# Patient Record
Sex: Female | Born: 1935
Health system: Southern US, Community
[De-identification: ages and names within clinical notes are randomized; demographics above are authoritative.]

## PROBLEM LIST (undated history)

## (undated) DIAGNOSIS — M1990 Unspecified osteoarthritis, unspecified site: Secondary | ICD-10-CM

## (undated) DIAGNOSIS — R002 Palpitations: Secondary | ICD-10-CM

## (undated) DIAGNOSIS — R202 Paresthesia of skin: Secondary | ICD-10-CM

## (undated) DIAGNOSIS — K589 Irritable bowel syndrome without diarrhea: Secondary | ICD-10-CM

## (undated) DIAGNOSIS — K219 Gastro-esophageal reflux disease without esophagitis: Secondary | ICD-10-CM

## (undated) DIAGNOSIS — I10 Essential (primary) hypertension: Secondary | ICD-10-CM

## (undated) DIAGNOSIS — I739 Peripheral vascular disease, unspecified: Secondary | ICD-10-CM

## (undated) DIAGNOSIS — E7800 Pure hypercholesterolemia, unspecified: Secondary | ICD-10-CM

## (undated) DIAGNOSIS — R079 Chest pain, unspecified: Secondary | ICD-10-CM

## (undated) DIAGNOSIS — F4321 Adjustment disorder with depressed mood: Secondary | ICD-10-CM

## (undated) DIAGNOSIS — I8390 Asymptomatic varicose veins of unspecified lower extremity: Secondary | ICD-10-CM

## (undated) DIAGNOSIS — I2510 Atherosclerotic heart disease of native coronary artery without angina pectoris: Secondary | ICD-10-CM

## (undated) DIAGNOSIS — I4510 Unspecified right bundle-branch block: Secondary | ICD-10-CM

## (undated) DIAGNOSIS — I6529 Occlusion and stenosis of unspecified carotid artery: Secondary | ICD-10-CM

## (undated) DIAGNOSIS — K635 Polyp of colon: Secondary | ICD-10-CM

## (undated) DIAGNOSIS — M542 Cervicalgia: Secondary | ICD-10-CM

## (undated) HISTORY — DX: Paresthesia of skin: R20.2

## (undated) HISTORY — DX: Unspecified osteoarthritis, unspecified site: M19.90

## (undated) HISTORY — DX: Occlusion and stenosis of unspecified carotid artery: I65.29

## (undated) HISTORY — DX: Polyp of colon: K63.5

## (undated) HISTORY — DX: Gastro-esophageal reflux disease without esophagitis: K21.9

## (undated) HISTORY — PX: LAPAROSCOPIC CHOLECYSTECTOMY: SUR755

## (undated) HISTORY — DX: Palpitations: R00.2

## (undated) HISTORY — DX: Chest pain, unspecified: R07.9

## (undated) HISTORY — DX: Peripheral vascular disease, unspecified: I73.9

## (undated) HISTORY — PX: APPENDECTOMY: SHX54

## (undated) HISTORY — PX: ROTATOR CUFF REPAIR: SHX139

## (undated) HISTORY — PX: TONSILLECTOMY: SUR1361

## (undated) HISTORY — PX: TUBAL LIGATION: SHX77

## (undated) HISTORY — DX: Cervicalgia: M54.2

## (undated) HISTORY — DX: Atherosclerotic heart disease of native coronary artery without angina pectoris: I25.10

## (undated) HISTORY — DX: Irritable bowel syndrome, unspecified: K58.9

## (undated) HISTORY — DX: Pure hypercholesterolemia, unspecified: E78.00

## (undated) HISTORY — DX: Adjustment disorder with depressed mood: F43.21

## (undated) HISTORY — PX: BREAST BIOPSY: SHX20

## (undated) HISTORY — DX: Unspecified right bundle-branch block: I45.10

---

## 1993-06-28 HISTORY — PX: BREAST EXCISIONAL BIOPSY: SUR124

## 1998-05-01 ENCOUNTER — Other Ambulatory Visit: Admission: RE | Admit: 1998-05-01 | Discharge: 1998-05-01 | Payer: Self-pay | Admitting: Obstetrics and Gynecology

## 1999-05-12 ENCOUNTER — Other Ambulatory Visit: Admission: RE | Admit: 1999-05-12 | Discharge: 1999-05-12 | Payer: Self-pay | Admitting: Obstetrics and Gynecology

## 1999-08-17 ENCOUNTER — Encounter: Admission: RE | Admit: 1999-08-17 | Discharge: 1999-08-17 | Payer: Self-pay | Admitting: Gastroenterology

## 1999-08-17 ENCOUNTER — Encounter: Payer: Self-pay | Admitting: Gastroenterology

## 1999-08-18 ENCOUNTER — Ambulatory Visit (HOSPITAL_COMMUNITY): Admission: RE | Admit: 1999-08-18 | Discharge: 1999-08-18 | Payer: Self-pay | Admitting: Gastroenterology

## 1999-08-18 ENCOUNTER — Encounter: Payer: Self-pay | Admitting: Gastroenterology

## 1999-09-02 ENCOUNTER — Encounter: Admission: RE | Admit: 1999-09-02 | Discharge: 1999-09-02 | Payer: Self-pay | Admitting: Gastroenterology

## 1999-09-02 ENCOUNTER — Encounter: Payer: Self-pay | Admitting: Gastroenterology

## 1999-09-24 ENCOUNTER — Ambulatory Visit (HOSPITAL_COMMUNITY): Admission: RE | Admit: 1999-09-24 | Discharge: 1999-09-24 | Payer: Self-pay | Admitting: Gastroenterology

## 2000-05-17 ENCOUNTER — Other Ambulatory Visit: Admission: RE | Admit: 2000-05-17 | Discharge: 2000-05-17 | Payer: Self-pay | Admitting: Obstetrics and Gynecology

## 2000-05-24 ENCOUNTER — Encounter: Admission: RE | Admit: 2000-05-24 | Discharge: 2000-05-24 | Payer: Self-pay | Admitting: Obstetrics and Gynecology

## 2000-05-24 ENCOUNTER — Encounter: Payer: Self-pay | Admitting: Obstetrics and Gynecology

## 2001-06-12 ENCOUNTER — Other Ambulatory Visit: Admission: RE | Admit: 2001-06-12 | Discharge: 2001-06-12 | Payer: Self-pay | Admitting: Obstetrics and Gynecology

## 2001-06-15 ENCOUNTER — Encounter: Payer: Self-pay | Admitting: Obstetrics and Gynecology

## 2001-06-15 ENCOUNTER — Encounter: Admission: RE | Admit: 2001-06-15 | Discharge: 2001-06-15 | Payer: Self-pay | Admitting: Obstetrics and Gynecology

## 2001-08-22 ENCOUNTER — Ambulatory Visit (HOSPITAL_COMMUNITY): Admission: RE | Admit: 2001-08-22 | Discharge: 2001-08-22 | Payer: Self-pay | Admitting: Gastroenterology

## 2002-01-19 ENCOUNTER — Encounter: Admission: RE | Admit: 2002-01-19 | Discharge: 2002-01-19 | Payer: Self-pay | Admitting: Obstetrics and Gynecology

## 2002-01-19 ENCOUNTER — Encounter: Payer: Self-pay | Admitting: Obstetrics and Gynecology

## 2002-02-12 ENCOUNTER — Encounter: Payer: Self-pay | Admitting: Internal Medicine

## 2002-02-12 ENCOUNTER — Encounter: Admission: RE | Admit: 2002-02-12 | Discharge: 2002-02-12 | Payer: Self-pay | Admitting: Internal Medicine

## 2002-07-17 ENCOUNTER — Encounter: Admission: RE | Admit: 2002-07-17 | Discharge: 2002-07-17 | Payer: Self-pay | Admitting: Obstetrics and Gynecology

## 2002-07-17 ENCOUNTER — Encounter: Payer: Self-pay | Admitting: Obstetrics and Gynecology

## 2002-07-18 ENCOUNTER — Encounter: Payer: Self-pay | Admitting: Obstetrics and Gynecology

## 2002-07-18 ENCOUNTER — Encounter: Admission: RE | Admit: 2002-07-18 | Discharge: 2002-07-18 | Payer: Self-pay | Admitting: Obstetrics and Gynecology

## 2003-07-19 ENCOUNTER — Encounter: Admission: RE | Admit: 2003-07-19 | Discharge: 2003-07-19 | Payer: Self-pay | Admitting: Obstetrics and Gynecology

## 2003-08-21 ENCOUNTER — Other Ambulatory Visit: Admission: RE | Admit: 2003-08-21 | Discharge: 2003-08-21 | Payer: Self-pay | Admitting: Obstetrics & Gynecology

## 2003-10-03 ENCOUNTER — Encounter: Admission: RE | Admit: 2003-10-03 | Discharge: 2003-10-03 | Payer: Self-pay | Admitting: Internal Medicine

## 2004-02-14 ENCOUNTER — Other Ambulatory Visit: Admission: RE | Admit: 2004-02-14 | Discharge: 2004-02-14 | Payer: Self-pay | Admitting: Obstetrics & Gynecology

## 2004-08-27 ENCOUNTER — Other Ambulatory Visit: Admission: RE | Admit: 2004-08-27 | Discharge: 2004-08-27 | Payer: Self-pay | Admitting: Obstetrics & Gynecology

## 2004-09-15 ENCOUNTER — Encounter: Admission: RE | Admit: 2004-09-15 | Discharge: 2004-09-15 | Payer: Self-pay | Admitting: Obstetrics & Gynecology

## 2004-11-11 ENCOUNTER — Observation Stay (HOSPITAL_COMMUNITY): Admission: RE | Admit: 2004-11-11 | Discharge: 2004-11-12 | Payer: Self-pay | Admitting: Specialist

## 2005-05-07 ENCOUNTER — Encounter: Admission: RE | Admit: 2005-05-07 | Discharge: 2005-05-07 | Payer: Self-pay | Admitting: Otolaryngology

## 2005-09-21 ENCOUNTER — Encounter: Admission: RE | Admit: 2005-09-21 | Discharge: 2005-09-21 | Payer: Self-pay | Admitting: Obstetrics & Gynecology

## 2005-09-29 ENCOUNTER — Other Ambulatory Visit: Admission: RE | Admit: 2005-09-29 | Discharge: 2005-09-29 | Payer: Self-pay | Admitting: Obstetrics & Gynecology

## 2005-09-29 ENCOUNTER — Encounter: Admission: RE | Admit: 2005-09-29 | Discharge: 2005-09-29 | Payer: Self-pay | Admitting: Obstetrics & Gynecology

## 2006-07-01 ENCOUNTER — Encounter: Admission: RE | Admit: 2006-07-01 | Discharge: 2006-07-01 | Payer: Self-pay | Admitting: Gastroenterology

## 2006-09-23 ENCOUNTER — Encounter: Admission: RE | Admit: 2006-09-23 | Discharge: 2006-09-23 | Payer: Self-pay | Admitting: Obstetrics & Gynecology

## 2006-11-09 ENCOUNTER — Encounter: Admission: RE | Admit: 2006-11-09 | Discharge: 2006-11-09 | Payer: Self-pay | Admitting: Obstetrics & Gynecology

## 2007-10-04 ENCOUNTER — Encounter: Admission: RE | Admit: 2007-10-04 | Discharge: 2007-10-04 | Payer: Self-pay | Admitting: Obstetrics & Gynecology

## 2007-10-13 ENCOUNTER — Encounter: Admission: RE | Admit: 2007-10-13 | Discharge: 2007-10-13 | Payer: Self-pay | Admitting: Obstetrics & Gynecology

## 2008-10-14 ENCOUNTER — Encounter: Admission: RE | Admit: 2008-10-14 | Discharge: 2008-10-14 | Payer: Self-pay | Admitting: Surgery

## 2009-03-11 ENCOUNTER — Encounter: Admission: RE | Admit: 2009-03-11 | Discharge: 2009-03-11 | Payer: Self-pay | Admitting: Gastroenterology

## 2009-06-26 ENCOUNTER — Ambulatory Visit (HOSPITAL_COMMUNITY): Admission: RE | Admit: 2009-06-26 | Discharge: 2009-06-26 | Payer: Self-pay | Admitting: Gastroenterology

## 2009-07-24 ENCOUNTER — Encounter: Admission: RE | Admit: 2009-07-24 | Discharge: 2009-07-24 | Payer: Self-pay | Admitting: Obstetrics & Gynecology

## 2009-08-28 ENCOUNTER — Encounter: Admission: RE | Admit: 2009-08-28 | Discharge: 2009-08-28 | Payer: Self-pay | Admitting: Internal Medicine

## 2009-10-22 ENCOUNTER — Encounter: Admission: RE | Admit: 2009-10-22 | Discharge: 2009-10-22 | Payer: Self-pay | Admitting: Obstetrics & Gynecology

## 2010-01-22 ENCOUNTER — Encounter (INDEPENDENT_AMBULATORY_CARE_PROVIDER_SITE_OTHER): Payer: Self-pay | Admitting: General Surgery

## 2010-01-22 ENCOUNTER — Observation Stay (HOSPITAL_COMMUNITY): Admission: EM | Admit: 2010-01-22 | Discharge: 2010-01-23 | Payer: Self-pay | Admitting: Emergency Medicine

## 2010-07-19 ENCOUNTER — Encounter: Payer: Self-pay | Admitting: Obstetrics & Gynecology

## 2010-08-10 ENCOUNTER — Other Ambulatory Visit: Payer: Self-pay | Admitting: Obstetrics & Gynecology

## 2010-08-10 DIAGNOSIS — N6453 Retraction of nipple: Secondary | ICD-10-CM

## 2010-08-10 DIAGNOSIS — N644 Mastodynia: Secondary | ICD-10-CM

## 2010-08-14 ENCOUNTER — Ambulatory Visit
Admission: RE | Admit: 2010-08-14 | Discharge: 2010-08-14 | Disposition: A | Discharge status: Home / Self Care | Payer: Medicare Other | Source: Ambulatory Visit | Attending: Obstetrics & Gynecology | Admitting: Obstetrics & Gynecology

## 2010-08-14 ENCOUNTER — Other Ambulatory Visit: Payer: Self-pay

## 2010-08-14 ENCOUNTER — Other Ambulatory Visit: Payer: Self-pay | Admitting: Obstetrics & Gynecology

## 2010-08-14 ENCOUNTER — Ambulatory Visit
Admission: RE | Admit: 2010-08-14 | Discharge: 2010-08-14 | Disposition: A | Discharge status: Home / Self Care | Payer: Managed Care, Other (non HMO) | Source: Ambulatory Visit | Attending: Obstetrics & Gynecology | Admitting: Obstetrics & Gynecology

## 2010-08-14 DIAGNOSIS — N644 Mastodynia: Secondary | ICD-10-CM

## 2010-08-14 DIAGNOSIS — N6453 Retraction of nipple: Secondary | ICD-10-CM

## 2010-08-21 ENCOUNTER — Other Ambulatory Visit: Payer: Self-pay | Admitting: Obstetrics & Gynecology

## 2010-08-21 DIAGNOSIS — N6453 Retraction of nipple: Secondary | ICD-10-CM

## 2010-08-21 DIAGNOSIS — N6489 Other specified disorders of breast: Secondary | ICD-10-CM

## 2010-08-21 DIAGNOSIS — Z1231 Encounter for screening mammogram for malignant neoplasm of breast: Secondary | ICD-10-CM

## 2010-08-26 ENCOUNTER — Ambulatory Visit
Admission: RE | Admit: 2010-08-26 | Discharge: 2010-08-26 | Disposition: A | Discharge status: Home / Self Care | Payer: Medicare Other | Source: Ambulatory Visit | Attending: Obstetrics & Gynecology | Admitting: Obstetrics & Gynecology

## 2010-08-26 DIAGNOSIS — N6489 Other specified disorders of breast: Secondary | ICD-10-CM

## 2010-08-31 ENCOUNTER — Ambulatory Visit
Admission: RE | Admit: 2010-08-31 | Discharge: 2010-08-31 | Disposition: A | Discharge status: Home / Self Care | Payer: Medicare Other | Source: Ambulatory Visit | Attending: Obstetrics & Gynecology | Admitting: Obstetrics & Gynecology

## 2010-08-31 DIAGNOSIS — N6453 Retraction of nipple: Secondary | ICD-10-CM

## 2010-08-31 MED ORDER — GADOBENATE DIMEGLUMINE 529 MG/ML IV SOLN
13.0000 mL | Freq: Once | INTRAVENOUS | Status: AC | PRN
  Administered 2010-08-31: 13 mL via INTRAVENOUS

## 2010-09-12 LAB — DIFFERENTIAL
Basophils Absolute: 0.1 10*3/uL (ref 0.0–0.1)
Eosinophils Relative: 0 % (ref 0–5)
Lymphocytes Relative: 20 % (ref 12–46)
Monocytes Absolute: 0.6 10*3/uL (ref 0.1–1.0)
Monocytes Relative: 5 % (ref 3–12)

## 2010-09-12 LAB — CBC
Platelets: 252 10*3/uL (ref 150–400)
RBC: 4.84 MIL/uL (ref 3.87–5.11)
WBC: 12.7 10*3/uL — ABNORMAL HIGH (ref 4.0–10.5)

## 2010-09-12 LAB — URINE MICROSCOPIC-ADD ON

## 2010-09-12 LAB — URINALYSIS, ROUTINE W REFLEX MICROSCOPIC
Bilirubin Urine: NEGATIVE
Glucose, UA: NEGATIVE mg/dL
Specific Gravity, Urine: 1.015 (ref 1.005–1.030)
Urobilinogen, UA: 0.2 mg/dL (ref 0.0–1.0)

## 2010-09-12 LAB — COMPREHENSIVE METABOLIC PANEL
AST: 29 U/L (ref 0–37)
Albumin: 4.2 g/dL (ref 3.5–5.2)
Alkaline Phosphatase: 43 U/L (ref 39–117)
Chloride: 101 mEq/L (ref 96–112)
Creatinine, Ser: 0.77 mg/dL (ref 0.4–1.2)
GFR calc Af Amer: >60 mL/min (ref >60)
Potassium: 3.3 mEq/L — ABNORMAL LOW (ref 3.5–5.1)
Total Bilirubin: 1 mg/dL (ref 0.3–1.2)
Total Protein: 7.4 g/dL (ref 6.0–8.3)

## 2010-11-13 NOTE — Op Note (Signed)
[name], [name]               ACCOUNT NO.:  [id number]   MEDICAL RECORD NO.:  [id number]          PATIENT TYPE:  AMB   LOCATION:  DAY                          FACILITY:  [facility]   PHYSICIAN:  Jene Every, M.D.    DATE OF BIRTH:  06-Jul-1935   DATE OF PROCEDURE:  DATE OF DISCHARGE:                                 OPERATIVE REPORT   PREOPERATIVE DIAGNOSES:  Impingement syndrome, rotator cuff tear, adhesive  capsulitis of the right shoulder.   POSTOPERATIVE DIAGNOSES:  Impingement syndrome, rotator cuff tear, adhesive  capsulitis of the right shoulder.   PROCEDURES PERFORMED:  Right shoulder open rotator cuff repair, subacromial  decompression.   SURGEON:  Jene Every, M.D.   ANESTHESIA:  General.   ASSISTANT:  Roma Schanz, P.A.   BRIEF HISTORY AND INDICATIONS:  A 74 year old with refractory shoulder pain  and adhesive capsulitis, hemorrhagic, indicating a rotator cuff tear 2 cm  from the supraspinatus.  Operative intervention was indicated for repair as  well as possible manipulation under anesthesia.  Risks and benefits were  discussed which including bleeding, infection, injury to vascular  structures, fracture, recurrent adhesive capsulitis, etc.   TECHNIQUE:  The patient was placed in the supine position.  After the  induction of adequate general anesthesia and 1 g of Kefzol, the right  shoulder and upper extremity was prepped and draped in the usual sterile  fashion.  First noted was abduction to approximately 30 degrees.  Forward  flexion was 45 degrees.  With a gentle manipulative pressure applied to the  arm, we were able to relax the capsule and achieve 110 degrees of flexion  and forward flexion to 100 as well.  There was good internal and external  rotation  This was done without difficulty and slowly.  Next, incision was  made over the anterior aspect of the acromion in Langer's lines.  Subcutaneous tissue was dissected.  Electrocautery was utilized to  achieve  hemostasis. Raphe between the anterior and lateral heads was identified and  developed.  Subperiosteal elevator was placed anteriorly and then dissected  from anterolateral aspect of the acromion.  Marcaine 0.25% with epinephrine  was infiltrated in the subcutaneous tissues.  The CA ligament was divided  and excised.  The anterolateral spur was removed with the [facility] rongeur and a  high-speed bur.  Digitally, I lysed adhesions in the subacromial space as  well.  A retracted 2-cm tear of the rotator cuff was noted.  It was advanced  with an Allis and digitally mobilized as well.  It was easily advanced to  the portion of the greater trochanter.  I decorticated beneath that,  freshened up the tendon to good bleeding tissue, placed two Mitek suture  anchors in the trough, advanced the tendon, secured it to the trough with  the Mitek suture anchors, and oversewed it 0 Vicryl in interrupted figure-of-  eight sutures for a watertight closure.  The remainder of the tendon was  viable and intact.  There was good range of motion without impingement.  The  wound was copiously irrigated with antibiotic irrigation.  The  raphe was  repaired with #1 Vicryl interrupted figure-of-eight suture.  The  subcutaneous tissue was reapproximated with 2-0 Vicryl simple sutures, and  the skin was reapproximated with 4-0 subcuticular Prolene.  The  wound was re-inforced with Steri-Strips, sterile dressing applied, and  placed supine on the hospital bed after an abduction pillow and sling  placed, and then extubated without difficulty and transported to the  recovery room in satisfactory condition.  There were no complications.  Minimal blood loss.      JB/MEDQ  D:  11/11/2004  T:  11/11/2004  Job:  [id number]

## 2011-01-14 ENCOUNTER — Ambulatory Visit
Admission: RE | Admit: 2011-01-14 | Discharge: 2011-01-14 | Disposition: A | Discharge status: Home / Self Care | Payer: Medicare Other | Source: Ambulatory Visit | Attending: Internal Medicine | Admitting: Internal Medicine

## 2011-01-14 ENCOUNTER — Other Ambulatory Visit: Payer: Self-pay | Admitting: Internal Medicine

## 2011-01-14 DIAGNOSIS — R1032 Left lower quadrant pain: Secondary | ICD-10-CM

## 2011-01-14 MED ORDER — IOHEXOL 300 MG/ML  SOLN
100.0000 mL | Freq: Once | INTRAMUSCULAR | Status: AC | PRN
  Administered 2011-01-14: 100 mL via INTRAVENOUS

## 2011-03-05 ENCOUNTER — Other Ambulatory Visit: Payer: Self-pay | Admitting: Orthopedic Surgery

## 2011-03-05 DIAGNOSIS — M545 Low back pain, unspecified: Secondary | ICD-10-CM

## 2011-03-15 ENCOUNTER — Inpatient Hospital Stay (HOSPITAL_COMMUNITY)
Admission: EM | Admit: 2011-03-15 | Discharge: 2011-03-16 | DRG: 287 | Disposition: A | Discharge status: Home / Self Care | Payer: Medicare Other | Attending: Cardiology | Admitting: Cardiology

## 2011-03-15 ENCOUNTER — Emergency Department (HOSPITAL_COMMUNITY): Payer: Medicare Other

## 2011-03-15 DIAGNOSIS — K589 Irritable bowel syndrome without diarrhea: Secondary | ICD-10-CM | POA: Diagnosis present

## 2011-03-15 DIAGNOSIS — E785 Hyperlipidemia, unspecified: Secondary | ICD-10-CM | POA: Diagnosis present

## 2011-03-15 DIAGNOSIS — I10 Essential (primary) hypertension: Secondary | ICD-10-CM | POA: Diagnosis present

## 2011-03-15 DIAGNOSIS — M542 Cervicalgia: Secondary | ICD-10-CM | POA: Diagnosis present

## 2011-03-15 DIAGNOSIS — Z87891 Personal history of nicotine dependence: Secondary | ICD-10-CM

## 2011-03-15 DIAGNOSIS — Z7982 Long term (current) use of aspirin: Secondary | ICD-10-CM

## 2011-03-15 DIAGNOSIS — K219 Gastro-esophageal reflux disease without esophagitis: Secondary | ICD-10-CM | POA: Diagnosis present

## 2011-03-15 DIAGNOSIS — R0789 Other chest pain: Principal | ICD-10-CM | POA: Diagnosis present

## 2011-03-15 LAB — TSH: TSH: 2.646 u[IU]/mL (ref 0.350–4.500)

## 2011-03-15 LAB — URINALYSIS, ROUTINE W REFLEX MICROSCOPIC
Bilirubin Urine: NEGATIVE
Ketones, ur: NEGATIVE mg/dL
Nitrite: NEGATIVE
Specific Gravity, Urine: 1.007 (ref 1.005–1.030)
Urobilinogen, UA: 0.2 mg/dL (ref 0.0–1.0)
pH: 7.5 (ref 5.0–8.0)

## 2011-03-15 LAB — POCT I-STAT TROPONIN I: Troponin i, poc: 0 ng/mL (ref 0.00–0.08)

## 2011-03-15 LAB — COMPREHENSIVE METABOLIC PANEL
ALT: 18 U/L (ref 0–35)
AST: 26 U/L (ref 0–37)
Calcium: 9.5 mg/dL (ref 8.4–10.5)
Creatinine, Ser: 0.6 mg/dL (ref 0.50–1.10)
GFR calc non Af Amer: >60 mL/min (ref >60)
Sodium: 134 mEq/L — ABNORMAL LOW (ref 135–145)
Total Protein: 6.8 g/dL (ref 6.0–8.3)

## 2011-03-15 LAB — HEMOGLOBIN A1C
Hgb A1c MFr Bld: 5.6 % (ref <5.7)
Mean Plasma Glucose: 114 mg/dL (ref <117)

## 2011-03-15 LAB — CBC
HCT: 34.6 % — ABNORMAL LOW (ref 36.0–46.0)
MCHC: 36.1 g/dL — ABNORMAL HIGH (ref 30.0–36.0)
Platelets: 210 10*3/uL (ref 150–400)
RDW: 12.3 % (ref 11.5–15.5)
WBC: 7.2 10*3/uL (ref 4.0–10.5)

## 2011-03-15 LAB — APTT: aPTT: 29 seconds (ref 24–37)

## 2011-03-15 LAB — CK TOTAL AND CKMB (NOT AT ARMC)
CK, MB: 2.2 ng/mL (ref 0.3–4.0)
Relative Index: INVALID (ref 0.0–2.5)

## 2011-03-15 LAB — URINE MICROSCOPIC-ADD ON

## 2011-03-15 LAB — TROPONIN I: Troponin I: <0.3 ng/mL (ref <0.30)

## 2011-03-15 LAB — PROTIME-INR: INR: 0.96 (ref 0.00–1.49)

## 2011-03-16 ENCOUNTER — Other Ambulatory Visit: Payer: Medicare Other

## 2011-03-16 LAB — CBC
HCT: 35 % — ABNORMAL LOW (ref 36.0–46.0)
Hemoglobin: 12.3 g/dL (ref 12.0–15.0)
MCH: 29.5 pg (ref 26.0–34.0)
MCHC: 35.1 g/dL (ref 30.0–36.0)
MCV: 83.9 fL (ref 78.0–100.0)
Platelets: 212 10*3/uL (ref 150–400)
RBC: 4.17 MIL/uL (ref 3.87–5.11)
RDW: 12.4 % (ref 11.5–15.5)
WBC: 7.2 10*3/uL (ref 4.0–10.5)

## 2011-03-16 LAB — CARDIAC PANEL(CRET KIN+CKTOT+MB+TROPI)
CK, MB: 2.1 ng/mL (ref 0.3–4.0)
Relative Index: INVALID (ref 0.0–2.5)
Total CK: 56 U/L (ref 7–177)
Troponin I: <0.3 ng/mL (ref <0.30)

## 2011-03-16 LAB — HEPARIN LEVEL (UNFRACTIONATED)
Heparin Unfractionated: 0.54 IU/mL (ref 0.30–0.70)
Heparin Unfractionated: <0.1 IU/mL — ABNORMAL LOW (ref 0.30–0.70)

## 2011-03-24 ENCOUNTER — Ambulatory Visit
Admission: RE | Admit: 2011-03-24 | Discharge: 2011-03-24 | Disposition: A | Discharge status: Home / Self Care | Payer: Medicare Other | Source: Ambulatory Visit | Attending: Orthopedic Surgery | Admitting: Orthopedic Surgery

## 2011-03-24 DIAGNOSIS — M545 Low back pain, unspecified: Secondary | ICD-10-CM

## 2011-03-26 NOTE — Discharge Summary (Signed)
  [name], [name]               ACCOUNT NO.:  [id number]  MEDICAL RECORD NO.:  [id number]  LOCATION:  3733                         FACILITY:  [facility]  PHYSICIAN:  Jake Bathe, MD      DATE OF BIRTH:  08-02-1935  DATE OF ADMISSION:  03/15/2011 DATE OF DISCHARGE:  03/16/2011                              DISCHARGE SUMMARY   FINAL DIAGNOSES: 1. Jaw pain, neck discomfort concerning for unstable angina. 2. Hyperlipidemia. 3. Hypertension. 4. Gastroesophageal reflux disease. 5. Irritable bowel syndrome.  PRIOR SURGERIES:  Appendectomy, tonsillectomy, tubal ligation, right rotator cuff surgery, and cholecystectomy.  ALLERGIES: 1. CEFTIN. 2. CIPRO.  PROCEDURES:  Left heart catheterization showing no angiographically significant coronary artery disease - tortuous tortuous vessels, normal EF, no gradient.  Cardiac markers were all negative.  Electrolytes unremarkable.  BRIEF HOSPITAL COURSE:  This is a 75 year old female admitted by Dr. Viann Gross last night for symptoms concerning for unstable angina with jaw pain and neck discomfort while she was out shopping with a feeling of weakness and uncomfortableness.  EMS was called and she was transferred to the ER.  She was given nitroglycerin which resulted in relief of the pain and also had aspirin as well.  EKG shows right bundle- branch block, but no ischemic changes.  Heparin was administered overnight.  Her chest pain had resolved overnight, however, she was taken to the Cardiac Catheterization Lab given her worrisome symptoms. Catheterization was reassuring showing no significant disease.  She was discharged postcatheterization, this is anticipated at this point.  DISCHARGE MEDICATIONS:  No changes from home medications.  Cardiac meds are Crestor 1/4 tablet of 5 mg dosing at bedtime, Gross oil 3 times a day 1 g, atenolol 25 mg in the morning, lisinopril/hydrochlorothiazide 1/2 tablet of 20/12.5 mg twice a day, aspirin 81  mg a day, nitroglycerin as needed, Estrace 0.5 mg tablet of 1 mg at bedtime, vitamin D, Prevacid 15 mg every morning, calcium, progesterone in oil 1 capsule daily at bedtime, Excedrin as needed, Metamucil twice a day, and Vagifem twice weekly.  FOLLOWUP:  She will have followup in the clinic in 1 week postcatheterization followup.  Reassurance has been given to the patient.  Continue to treat hypertension.  Blood pressure was in the 130 systolic on discharge.     Jake Bathe, MD     MCS/MEDQ  D:  03/16/2011  T:  03/16/2011  Job:  [id number]  Electronically Signed by Donato Schultz MD on 03/26/2011 06:47:28 AM

## 2011-03-26 NOTE — Cardiovascular Report (Signed)
  [name], RUD NO.:  [id number]  MEDICAL RECORD NO.:  [id number]  LOCATION:  3733                         FACILITY:  [facility]  PHYSICIAN:  Jake Bathe, MD      DATE OF BIRTH:  [birth year]  DATE OF PROCEDURE:  03/16/2011 DATE OF DISCHARGE:                           CARDIAC CATHETERIZATION   PROCEDURE:  Left heart catheterization via the right femoral artery approach, selective coronary angiography, left ventriculogram.  INDICATIONS:  A 75 year old female with unstable angina, jaw pain with advanced age, hypertension, hyperlipidemia with previous nuclear stress test showing no evidence of ischemia.  Dr. Donnie Aho admit her overnight and her story was concerning for unstable angina.  EKG showed right bundle-branch block pattern.  Troponins were negative.  PROCEDURE DETAILS:  Informed consent was obtained.  Risk of stroke, heart attack, death, renal impairment, arterial damage, bleeding were explained to the patient at length.  Questions were answered. Alternatives of treatments were discussed.  The right radial artery approach was attempted, however, after cannulating the radial artery there was minimal flow and a wire was not able to be passed, likely vasospasm.  This approach was aborted and the right groin approach was taken.  This was successful after 1% lidocaine was utilized and femoral head visualized with fluoroscopy.  A 5-French sheath was inserted without difficulty in the right femoral artery.  Judkins left #4 and a Judkins right #4 catheter was used to selectively cannulate the coronary arteries and an angled pigtail was used to insert into the left ventricle.  Multiple views with hand injection of Omnipaque were obtained and a left ventriculogram was performed utilizing 25 mL of contrast in the RAO position.  Total contrast volume was approximately 50 mL.  Total fluoro time was 1.5 minutes.  FINDINGS:  Left main artery splits into the LAD and  circumflex artery. There are 2 diagonal branches, the second of which encompasses quite a significant territory.  The circumflex has 3 obtuse marginal branches and the right coronary artery is the dominant vessel giving rise to the posterior descending artery and 2 other posterolateral branches.  There is no angiographically significant coronary artery disease present.  The vessels are all tortuous likely from longstanding hypertension.  Left ventricular ejection fraction is 65-70% with no wall motion abnormality. There is no gradient across the aortic valve.  There is no significant mitral regurgitation present.  There is no evidence of abdominal aortic aneurysm.  Her distal abdominal aorta is small in caliber.  Left ventricular systolic pressure 158 with an end-diastolic pressure of 16, aortic pressure 151/65 with a mean of 94 mmHg.  No significant gradient across the aortic valve.  PLAN:  We will go ahead and discharge later on today after sheath pull and stable groin.  Reassuring news has been given to the patient.  Chest discomfort/jaw pain likely musculoskeletal or perhaps GERD related.  We will see in followup in 1 week.     Jake Bathe, MD     MCS/MEDQ  D:  03/16/2011  T:  03/16/2011  Job:  [id number]  Electronically Signed by Donato Schultz MD on 03/26/2011 06:47:33 AM

## 2011-03-31 NOTE — H&P (Signed)
NAMEGAY, MONCIVAIS NO.:  [id number]  MEDICAL RECORD NO.:  [id number]  LOCATION:  [facility]                         FACILITY:  [facility]  PHYSICIAN:  Georga Hacking, M.D.DATE OF BIRTH:  10-23-1935  DATE OF ADMISSION:  03/15/2011                              HISTORY & PHYSICAL   REASON FOR ADMISSION:  Jaw pain and neck discomfort.  HISTORY:  The patient is a 75 year old female with a history of hypertension, hyperlipidemia, reflux esophagitis.  She had some vague exertional chest discomfort and evidently had a nuclear perfusion scan that did not show significant ischemia.  She has had nitroglycerin over the years, but has never used it.  She was in her usual state of health today and was out shopping and had the onset of left jaw discomfort associated with the feeling of weakness and uncomfortableness.  EMS was called and she was transported to the [facility] Emergency Room.  She was given nitroglycerin which resulted relief of the pain and also had aspirin that was also administered.  Since arrival, her troponin was normal and other labs were unremarkable.  Chest x-ray was unremarkable and EKG shows a right posterior fascicular block and right bundle-branch block, but no ischemic changes.  She was admitted at this time for unstable angina.  PAST MEDICAL HISTORY:  Remarkable for hyperlipidemia, hypertension, reflux esophagitis, history of irritable bowel syndrome.  PREVIOUS SURGERIES:  Appendectomy, tonsillectomy, bilateral tubal ligation, right rotator cuff surgery, cholecystectomy.  ALLERGIES:  CEFTIN and CIPRO.  CURRENT MEDICATIONS:  Aspirin, atenolol, calcium, chlordiazepoxide, Crestor, estradiol, Excedrin, Back and Body fish oil, lisinopril, Metamucil, nitroglycerin p.r.n., nystatin, Prevacid, Prometrium, Vagifem, vitamin D2.  SOCIAL HISTORY:  She has been married for 48 years.  She is a retired Airline pilot for [employer].  Has 2 children.  She smoked  briefly in her 80s, but has not smoked since then.  FAMILY HISTORY:  Father died at age 9 of multiple problems including heart and kidney, in general body failure.  Mother died of Alzheimer's at age 89, but had a history of vascular aortic disease.  One sister had significant heart and kidney dysfunction.  REVIEW OF SYSTEMS:  Her weight has been stable.  She has no skin problems.  No eye, ear, nose, or throat problems.  No difficulty swallowing.  She does have symptoms of reflux esophagitis and significant constipation.  Notable for recent urinary tract infection. No cough, wheezing, or hemoptysis.  Normally, no dyspnea, has significant headaches and has had workup for this in the past. Arthritis of both of her knees.  Other than as noted above, the remainder of systems is unremarkable.  PHYSICAL EXAMINATION:  GENERAL:  She is a mildly obese pleasant white female, currently in no acute distress. VITAL SIGNS:  Blood pressure is currently 160/70, pulse is currently 72 and regular. SKIN:  Warm and dry. HEENT:  EOMI.  PERRLA.  C and S clear.  Fundi not examined.  Pharynx negative. NECK:  Supple without masses, JVD, thyromegaly, or bruits. LUNGS:  Clear to A and P. CARDIAC:  Normal S1 and S2.  No S3, S4, or murmur.  PMI is nondisplaced. ABDOMEN:  Soft and nontender.  No mass,  hepatosplenomegaly, or aneurysm. Femoral pulses are present at 2+.  Peripheral pulses are 2+ bilaterally. There is no peripheral edema noted. NEUROLOGIC:  Normal cranial nerves, sensory and motor intact.  Good strength bilaterally.  EKG shows sinus rhythm with right bundle-branch block, left anterior hemiblock.  Chest x-ray showed clear lung fields.  CBC was normal and chemistry panel shows a potassium of 3.7 and is otherwise unremarkable.  Initial troponin was negative.  IMPRESSION: 1. Prolonged chest and prolonged jaw discomfort, consistent with     unstable angina pectoris. 2. History of exertional chest  pain, consistent with angina with     clinical diagnosis of angina. 3. Hypertension, currently not well controlled. 4. Hyperlipidemia, under treatment. 5. History of reflux esophagitis. 6. History of headaches. 7. Irritable bowel syndrome.  RECOMMENDATIONS:  The patient will be placed on IV heparin.  Serial enzymes will be obtained, continue on beta-blocker and aspirin.  Keep n.p.o. for possible catheterization as Dr. Anne Fu desires.     Georga Hacking, M.D.     WST/MEDQ  D:  03/15/2011  T:  03/15/2011  Job:  [id number]  cc:   Pearla Dubonnet, M.D. Jake Bathe, MD  Electronically Signed by Lacretia Nicks. Donnie Aho M.D. on 03/31/2011 12:38:18 PM

## 2011-04-02 ENCOUNTER — Ambulatory Visit (HOSPITAL_COMMUNITY)
Admission: RE | Admit: 2011-04-02 | Discharge: 2011-04-02 | Disposition: A | Discharge status: Home / Self Care | Payer: Medicare Other | Source: Ambulatory Visit | Attending: Gastroenterology | Admitting: Gastroenterology

## 2011-04-02 DIAGNOSIS — K219 Gastro-esophageal reflux disease without esophagitis: Secondary | ICD-10-CM | POA: Insufficient documentation

## 2011-04-02 DIAGNOSIS — I10 Essential (primary) hypertension: Secondary | ICD-10-CM | POA: Insufficient documentation

## 2011-04-02 DIAGNOSIS — K921 Melena: Secondary | ICD-10-CM | POA: Insufficient documentation

## 2011-04-06 NOTE — Op Note (Signed)
  NAMEANNALUCIA, LAINO NO.:  [id number]  MEDICAL RECORD NO.:  [id number]  LOCATION:  [facility]                         FACILITY:  [facility]  PHYSICIAN:  Danise Edge, M.D.   DATE OF BIRTH:  April 24, 1936  DATE OF PROCEDURE: DATE OF DISCHARGE:                              OPERATIVE REPORT   REFERRING PHYSICIAN:  Pearla Dubonnet, M.D.  HISTORY:  Ms. Lawrence Roldan is a 75 year old female, born on 1936/06/22.  The patient has unexplained painless hematochezia associated with a normal hemoglobin.  In 1998, the patient underwent a colonoscopy with removal of a rectal adenomatous polyp.  She underwent normal surveillance colonoscopies in 2001, 2006, and 2010.  ENDOSCOPIST:  Danise Edge, M.D.  PREMEDICATIONS: 1. Fentanyl 75 mcg. 2. Versed 9 mg.  PROCEDURE:  The patient was placed in the left lateral decubitus position.  Anal inspection and digital rectal exam were normal.  The Pentax pediatric colonoscope was introduced into the rectum and advanced to the cecum.  A normal-appearing ileocecal valve and appendiceal orifice were identified.  Colonic preparation for the exam today was good.  1. Rectum normal.  Retroflexed view of the distal rectum normal. 2. Sigmoid colon and descending colon normal. 3. Splenic flexure normal. 4. Mass transverse colon normal. 5. Hepatic flexure normal. 6. Ascending colon normal. 7. Cecum and ileocecal valve normal.  ASSESSMENT:  Normal diagnostic proctocolonoscopy of the cecum.  I cannot explain the patient's intermittent hematochezia.          ______________________________ Danise Edge, M.D.     MJ/MEDQ  D:  04/02/2011  T:  04/02/2011  Job:  [id number]  cc:   Pearla Dubonnet, M.D. Fax: [phone]  Electronically Signed by Danise Edge M.D. on 04/06/2011 04:09:53 PM

## 2011-04-07 ENCOUNTER — Emergency Department (HOSPITAL_COMMUNITY): Payer: Medicare Other

## 2011-04-07 ENCOUNTER — Emergency Department (HOSPITAL_COMMUNITY)
Admission: EM | Admit: 2011-04-07 | Discharge: 2011-04-08 | Disposition: A | Discharge status: Home / Self Care | Payer: Medicare Other | Attending: Emergency Medicine | Admitting: Emergency Medicine

## 2011-04-07 DIAGNOSIS — H532 Diplopia: Secondary | ICD-10-CM | POA: Insufficient documentation

## 2011-04-07 DIAGNOSIS — I10 Essential (primary) hypertension: Secondary | ICD-10-CM | POA: Insufficient documentation

## 2011-04-07 DIAGNOSIS — H538 Other visual disturbances: Secondary | ICD-10-CM | POA: Insufficient documentation

## 2011-04-07 LAB — DIFFERENTIAL
Basophils Absolute: 0.1 10*3/uL (ref 0.0–0.1)
Eosinophils Relative: 2 % (ref 0–5)
Lymphocytes Relative: 47 % — ABNORMAL HIGH (ref 12–46)
Lymphs Abs: 2.6 10*3/uL (ref 0.7–4.0)
Monocytes Absolute: 0.3 10*3/uL (ref 0.1–1.0)
Monocytes Relative: 6 % (ref 3–12)

## 2011-04-07 LAB — BASIC METABOLIC PANEL
BUN: 16 mg/dL (ref 6–23)
Creatinine, Ser: 0.82 mg/dL (ref 0.50–1.10)
GFR calc Af Amer: 80 mL/min — ABNORMAL LOW (ref >90)
GFR calc non Af Amer: 69 mL/min — ABNORMAL LOW (ref >90)
Glucose, Bld: 89 mg/dL (ref 70–99)

## 2011-04-07 LAB — CBC
HCT: 33.5 % — ABNORMAL LOW (ref 36.0–46.0)
Hemoglobin: 11.9 g/dL — ABNORMAL LOW (ref 12.0–15.0)
MCH: 30 pg (ref 26.0–34.0)
MCHC: 35.5 g/dL (ref 30.0–36.0)
MCV: 84.4 fL (ref 78.0–100.0)
RDW: 12.3 % (ref 11.5–15.5)

## 2011-04-08 ENCOUNTER — Encounter (HOSPITAL_COMMUNITY): Payer: Self-pay

## 2011-07-08 DIAGNOSIS — R002 Palpitations: Secondary | ICD-10-CM | POA: Diagnosis not present

## 2011-07-08 DIAGNOSIS — E782 Mixed hyperlipidemia: Secondary | ICD-10-CM | POA: Diagnosis not present

## 2011-07-08 DIAGNOSIS — I6529 Occlusion and stenosis of unspecified carotid artery: Secondary | ICD-10-CM | POA: Diagnosis not present

## 2011-07-12 ENCOUNTER — Other Ambulatory Visit: Payer: Self-pay | Admitting: Internal Medicine

## 2011-07-12 DIAGNOSIS — R109 Unspecified abdominal pain: Secondary | ICD-10-CM | POA: Diagnosis not present

## 2011-07-12 DIAGNOSIS — R300 Dysuria: Secondary | ICD-10-CM | POA: Diagnosis not present

## 2011-07-14 ENCOUNTER — Ambulatory Visit
Admission: RE | Admit: 2011-07-14 | Discharge: 2011-07-14 | Disposition: A | Discharge status: Home / Self Care | Payer: Medicare Other | Source: Ambulatory Visit | Attending: Internal Medicine | Admitting: Internal Medicine

## 2011-07-14 DIAGNOSIS — R002 Palpitations: Secondary | ICD-10-CM | POA: Diagnosis not present

## 2011-07-14 DIAGNOSIS — I6529 Occlusion and stenosis of unspecified carotid artery: Secondary | ICD-10-CM | POA: Diagnosis not present

## 2011-07-14 DIAGNOSIS — Z9089 Acquired absence of other organs: Secondary | ICD-10-CM | POA: Diagnosis not present

## 2011-07-14 DIAGNOSIS — R109 Unspecified abdominal pain: Secondary | ICD-10-CM | POA: Diagnosis not present

## 2011-07-14 DIAGNOSIS — E782 Mixed hyperlipidemia: Secondary | ICD-10-CM | POA: Diagnosis not present

## 2011-07-14 DIAGNOSIS — N281 Cyst of kidney, acquired: Secondary | ICD-10-CM | POA: Diagnosis not present

## 2011-07-16 DIAGNOSIS — I6529 Occlusion and stenosis of unspecified carotid artery: Secondary | ICD-10-CM | POA: Diagnosis not present

## 2011-07-21 DIAGNOSIS — R300 Dysuria: Secondary | ICD-10-CM | POA: Diagnosis not present

## 2011-07-21 DIAGNOSIS — R109 Unspecified abdominal pain: Secondary | ICD-10-CM | POA: Diagnosis not present

## 2011-07-21 DIAGNOSIS — H6980 Other specified disorders of Eustachian tube, unspecified ear: Secondary | ICD-10-CM | POA: Diagnosis not present

## 2011-07-21 DIAGNOSIS — M79609 Pain in unspecified limb: Secondary | ICD-10-CM | POA: Diagnosis not present

## 2011-07-21 DIAGNOSIS — E782 Mixed hyperlipidemia: Secondary | ICD-10-CM | POA: Diagnosis not present

## 2011-07-26 ENCOUNTER — Other Ambulatory Visit: Payer: Self-pay | Admitting: Obstetrics & Gynecology

## 2011-07-26 DIAGNOSIS — Z1231 Encounter for screening mammogram for malignant neoplasm of breast: Secondary | ICD-10-CM

## 2011-09-01 ENCOUNTER — Ambulatory Visit
Admission: RE | Admit: 2011-09-01 | Discharge: 2011-09-01 | Disposition: A | Discharge status: Home / Self Care | Payer: Medicare Other | Source: Ambulatory Visit | Attending: Obstetrics & Gynecology | Admitting: Obstetrics & Gynecology

## 2011-09-01 DIAGNOSIS — Z1231 Encounter for screening mammogram for malignant neoplasm of breast: Secondary | ICD-10-CM | POA: Diagnosis not present

## 2011-11-08 DIAGNOSIS — M546 Pain in thoracic spine: Secondary | ICD-10-CM | POA: Diagnosis not present

## 2011-11-12 ENCOUNTER — Encounter (HOSPITAL_COMMUNITY): Payer: Self-pay

## 2011-11-12 ENCOUNTER — Other Ambulatory Visit (HOSPITAL_COMMUNITY): Payer: Self-pay | Admitting: Internal Medicine

## 2011-11-12 ENCOUNTER — Ambulatory Visit (HOSPITAL_COMMUNITY)
Admission: RE | Admit: 2011-11-12 | Discharge: 2011-11-12 | Disposition: A | Discharge status: Home / Self Care | Payer: Medicare Other | Source: Ambulatory Visit | Attending: Internal Medicine | Admitting: Internal Medicine

## 2011-11-12 DIAGNOSIS — R7989 Other specified abnormal findings of blood chemistry: Secondary | ICD-10-CM

## 2011-11-12 DIAGNOSIS — R079 Chest pain, unspecified: Secondary | ICD-10-CM | POA: Insufficient documentation

## 2011-11-12 DIAGNOSIS — R791 Abnormal coagulation profile: Secondary | ICD-10-CM | POA: Insufficient documentation

## 2011-11-12 HISTORY — DX: Essential (primary) hypertension: I10

## 2011-11-12 MED ORDER — IOHEXOL 350 MG/ML SOLN
100.0000 mL | Freq: Once | INTRAVENOUS | Status: AC | PRN
  Administered 2011-11-12: 100 mL via INTRAVENOUS

## 2011-11-16 DIAGNOSIS — M546 Pain in thoracic spine: Secondary | ICD-10-CM | POA: Diagnosis not present

## 2011-12-02 DIAGNOSIS — L821 Other seborrheic keratosis: Secondary | ICD-10-CM | POA: Diagnosis not present

## 2011-12-02 DIAGNOSIS — L219 Seborrheic dermatitis, unspecified: Secondary | ICD-10-CM | POA: Diagnosis not present

## 2011-12-02 DIAGNOSIS — Z85828 Personal history of other malignant neoplasm of skin: Secondary | ICD-10-CM | POA: Diagnosis not present

## 2011-12-22 DIAGNOSIS — M546 Pain in thoracic spine: Secondary | ICD-10-CM | POA: Diagnosis not present

## 2011-12-22 DIAGNOSIS — Z0000 Encounter for general adult medical examination without abnormal findings: Secondary | ICD-10-CM | POA: Diagnosis not present

## 2011-12-22 DIAGNOSIS — I10 Essential (primary) hypertension: Secondary | ICD-10-CM | POA: Diagnosis not present

## 2011-12-22 DIAGNOSIS — I6529 Occlusion and stenosis of unspecified carotid artery: Secondary | ICD-10-CM | POA: Diagnosis not present

## 2011-12-22 DIAGNOSIS — K589 Irritable bowel syndrome without diarrhea: Secondary | ICD-10-CM | POA: Diagnosis not present

## 2011-12-22 DIAGNOSIS — Z1331 Encounter for screening for depression: Secondary | ICD-10-CM | POA: Diagnosis not present

## 2011-12-22 DIAGNOSIS — K219 Gastro-esophageal reflux disease without esophagitis: Secondary | ICD-10-CM | POA: Diagnosis not present

## 2011-12-22 DIAGNOSIS — K5900 Constipation, unspecified: Secondary | ICD-10-CM | POA: Diagnosis not present

## 2012-01-06 DIAGNOSIS — I10 Essential (primary) hypertension: Secondary | ICD-10-CM | POA: Diagnosis not present

## 2012-01-06 DIAGNOSIS — K589 Irritable bowel syndrome without diarrhea: Secondary | ICD-10-CM | POA: Diagnosis not present

## 2012-01-06 DIAGNOSIS — M546 Pain in thoracic spine: Secondary | ICD-10-CM | POA: Diagnosis not present

## 2012-01-06 DIAGNOSIS — Z79899 Other long term (current) drug therapy: Secondary | ICD-10-CM | POA: Diagnosis not present

## 2012-01-06 DIAGNOSIS — I6529 Occlusion and stenosis of unspecified carotid artery: Secondary | ICD-10-CM | POA: Diagnosis not present

## 2012-01-06 DIAGNOSIS — E7800 Pure hypercholesterolemia, unspecified: Secondary | ICD-10-CM | POA: Diagnosis not present

## 2012-01-06 DIAGNOSIS — K219 Gastro-esophageal reflux disease without esophagitis: Secondary | ICD-10-CM | POA: Diagnosis not present

## 2012-01-06 DIAGNOSIS — K5900 Constipation, unspecified: Secondary | ICD-10-CM | POA: Diagnosis not present

## 2012-01-13 DIAGNOSIS — N760 Acute vaginitis: Secondary | ICD-10-CM | POA: Diagnosis not present

## 2012-01-13 DIAGNOSIS — N390 Urinary tract infection, site not specified: Secondary | ICD-10-CM | POA: Diagnosis not present

## 2012-02-09 DIAGNOSIS — R109 Unspecified abdominal pain: Secondary | ICD-10-CM | POA: Diagnosis not present

## 2012-03-09 DIAGNOSIS — N390 Urinary tract infection, site not specified: Secondary | ICD-10-CM | POA: Diagnosis not present

## 2012-03-09 DIAGNOSIS — M949 Disorder of cartilage, unspecified: Secondary | ICD-10-CM | POA: Diagnosis not present

## 2012-03-09 DIAGNOSIS — R87610 Atypical squamous cells of undetermined significance on cytologic smear of cervix (ASC-US): Secondary | ICD-10-CM | POA: Diagnosis not present

## 2012-03-09 DIAGNOSIS — B3731 Acute candidiasis of vulva and vagina: Secondary | ICD-10-CM | POA: Diagnosis not present

## 2012-03-09 DIAGNOSIS — B373 Candidiasis of vulva and vagina: Secondary | ICD-10-CM | POA: Diagnosis not present

## 2012-03-09 DIAGNOSIS — M899 Disorder of bone, unspecified: Secondary | ICD-10-CM | POA: Diagnosis not present

## 2012-03-09 DIAGNOSIS — N951 Menopausal and female climacteric states: Secondary | ICD-10-CM | POA: Diagnosis not present

## 2012-03-09 DIAGNOSIS — N950 Postmenopausal bleeding: Secondary | ICD-10-CM | POA: Diagnosis not present

## 2012-03-09 DIAGNOSIS — Z124 Encounter for screening for malignant neoplasm of cervix: Secondary | ICD-10-CM | POA: Diagnosis not present

## 2012-03-22 DIAGNOSIS — N952 Postmenopausal atrophic vaginitis: Secondary | ICD-10-CM | POA: Diagnosis not present

## 2012-03-22 DIAGNOSIS — N390 Urinary tract infection, site not specified: Secondary | ICD-10-CM | POA: Diagnosis not present

## 2012-03-22 DIAGNOSIS — N281 Cyst of kidney, acquired: Secondary | ICD-10-CM | POA: Diagnosis not present

## 2012-04-26 DIAGNOSIS — Z23 Encounter for immunization: Secondary | ICD-10-CM | POA: Diagnosis not present

## 2012-05-10 DIAGNOSIS — N281 Cyst of kidney, acquired: Secondary | ICD-10-CM | POA: Diagnosis not present

## 2012-05-10 DIAGNOSIS — N3020 Other chronic cystitis without hematuria: Secondary | ICD-10-CM | POA: Diagnosis not present

## 2012-05-10 DIAGNOSIS — N952 Postmenopausal atrophic vaginitis: Secondary | ICD-10-CM | POA: Diagnosis not present

## 2012-07-07 DIAGNOSIS — Z0389 Encounter for observation for other suspected diseases and conditions ruled out: Secondary | ICD-10-CM | POA: Diagnosis not present

## 2012-07-07 DIAGNOSIS — R002 Palpitations: Secondary | ICD-10-CM | POA: Diagnosis not present

## 2012-07-07 DIAGNOSIS — I10 Essential (primary) hypertension: Secondary | ICD-10-CM | POA: Diagnosis not present

## 2012-07-07 DIAGNOSIS — E782 Mixed hyperlipidemia: Secondary | ICD-10-CM | POA: Diagnosis not present

## 2012-07-07 DIAGNOSIS — I4510 Unspecified right bundle-branch block: Secondary | ICD-10-CM | POA: Diagnosis not present

## 2012-07-07 DIAGNOSIS — I6529 Occlusion and stenosis of unspecified carotid artery: Secondary | ICD-10-CM | POA: Diagnosis not present

## 2012-07-10 DIAGNOSIS — R109 Unspecified abdominal pain: Secondary | ICD-10-CM | POA: Diagnosis not present

## 2012-07-10 DIAGNOSIS — E782 Mixed hyperlipidemia: Secondary | ICD-10-CM | POA: Diagnosis not present

## 2012-07-10 DIAGNOSIS — Z79899 Other long term (current) drug therapy: Secondary | ICD-10-CM | POA: Diagnosis not present

## 2012-07-17 DIAGNOSIS — IMO0001 Reserved for inherently not codable concepts without codable children: Secondary | ICD-10-CM | POA: Diagnosis not present

## 2012-07-17 DIAGNOSIS — M542 Cervicalgia: Secondary | ICD-10-CM | POA: Diagnosis not present

## 2012-08-08 ENCOUNTER — Other Ambulatory Visit: Payer: Self-pay | Admitting: Obstetrics & Gynecology

## 2012-08-08 DIAGNOSIS — Z1231 Encounter for screening mammogram for malignant neoplasm of breast: Secondary | ICD-10-CM

## 2012-09-04 ENCOUNTER — Ambulatory Visit
Admission: RE | Admit: 2012-09-04 | Discharge: 2012-09-04 | Disposition: A | Discharge status: Home / Self Care | Payer: Medicare Other | Source: Ambulatory Visit | Attending: Obstetrics & Gynecology | Admitting: Obstetrics & Gynecology

## 2012-09-04 DIAGNOSIS — Z1231 Encounter for screening mammogram for malignant neoplasm of breast: Secondary | ICD-10-CM

## 2012-09-14 DIAGNOSIS — Z124 Encounter for screening for malignant neoplasm of cervix: Secondary | ICD-10-CM | POA: Diagnosis not present

## 2012-09-14 DIAGNOSIS — Z9189 Other specified personal risk factors, not elsewhere classified: Secondary | ICD-10-CM | POA: Diagnosis not present

## 2012-09-19 DIAGNOSIS — M25819 Other specified joint disorders, unspecified shoulder: Secondary | ICD-10-CM | POA: Diagnosis not present

## 2012-09-19 DIAGNOSIS — M542 Cervicalgia: Secondary | ICD-10-CM | POA: Diagnosis not present

## 2012-10-10 DIAGNOSIS — B009 Herpesviral infection, unspecified: Secondary | ICD-10-CM | POA: Diagnosis not present

## 2012-10-10 DIAGNOSIS — L578 Other skin changes due to chronic exposure to nonionizing radiation: Secondary | ICD-10-CM | POA: Diagnosis not present

## 2012-10-10 DIAGNOSIS — Z85828 Personal history of other malignant neoplasm of skin: Secondary | ICD-10-CM | POA: Diagnosis not present

## 2012-10-10 DIAGNOSIS — D239 Other benign neoplasm of skin, unspecified: Secondary | ICD-10-CM | POA: Diagnosis not present

## 2012-10-10 DIAGNOSIS — L821 Other seborrheic keratosis: Secondary | ICD-10-CM | POA: Diagnosis not present

## 2012-10-10 DIAGNOSIS — D1801 Hemangioma of skin and subcutaneous tissue: Secondary | ICD-10-CM | POA: Diagnosis not present

## 2012-11-16 DIAGNOSIS — N3020 Other chronic cystitis without hematuria: Secondary | ICD-10-CM | POA: Diagnosis not present

## 2012-12-22 DIAGNOSIS — Z79899 Other long term (current) drug therapy: Secondary | ICD-10-CM | POA: Diagnosis not present

## 2012-12-22 DIAGNOSIS — K5900 Constipation, unspecified: Secondary | ICD-10-CM | POA: Diagnosis not present

## 2012-12-22 DIAGNOSIS — Z1331 Encounter for screening for depression: Secondary | ICD-10-CM | POA: Diagnosis not present

## 2012-12-22 DIAGNOSIS — M542 Cervicalgia: Secondary | ICD-10-CM | POA: Diagnosis not present

## 2012-12-22 DIAGNOSIS — F4321 Adjustment disorder with depressed mood: Secondary | ICD-10-CM | POA: Diagnosis not present

## 2012-12-22 DIAGNOSIS — I10 Essential (primary) hypertension: Secondary | ICD-10-CM | POA: Diagnosis not present

## 2012-12-22 DIAGNOSIS — K219 Gastro-esophageal reflux disease without esophagitis: Secondary | ICD-10-CM | POA: Diagnosis not present

## 2012-12-22 DIAGNOSIS — E782 Mixed hyperlipidemia: Secondary | ICD-10-CM | POA: Diagnosis not present

## 2012-12-22 DIAGNOSIS — E559 Vitamin D deficiency, unspecified: Secondary | ICD-10-CM | POA: Diagnosis not present

## 2012-12-22 DIAGNOSIS — Z0000 Encounter for general adult medical examination without abnormal findings: Secondary | ICD-10-CM | POA: Diagnosis not present

## 2013-03-13 DIAGNOSIS — B373 Candidiasis of vulva and vagina: Secondary | ICD-10-CM | POA: Diagnosis not present

## 2013-03-13 DIAGNOSIS — Z124 Encounter for screening for malignant neoplasm of cervix: Secondary | ICD-10-CM | POA: Diagnosis not present

## 2013-03-13 DIAGNOSIS — B3731 Acute candidiasis of vulva and vagina: Secondary | ICD-10-CM | POA: Diagnosis not present

## 2013-04-02 ENCOUNTER — Telehealth: Payer: Self-pay | Admitting: Cardiology

## 2013-04-02 NOTE — Telephone Encounter (Signed)
New Problem:  Pt states she just found a full bottle of medicine in her cabinet. Pt states it looks like she's never taken any b/c the bottle is full. Pt would like to know if it's a medicine she should be taking. Diltiazem 24H-ER 120 mg 1 capsule a day

## 2013-04-03 NOTE — Telephone Encounter (Signed)
Advised patient per last visit with Dr. Anne Fu, his note stated that the patient isn't on Diltiazem 24H-ER 120 mg. Patient verbalized understanding.

## 2013-04-17 ENCOUNTER — Other Ambulatory Visit: Payer: Self-pay | Admitting: Cardiology

## 2013-04-17 NOTE — Telephone Encounter (Signed)
Follow up    Went to get pres for lisinopril---it was for a 45day----want to to be for a 90day supply----[facility]---Pls call and fix---pt is out/

## 2013-04-23 DIAGNOSIS — Z23 Encounter for immunization: Secondary | ICD-10-CM | POA: Diagnosis not present

## 2013-04-30 ENCOUNTER — Other Ambulatory Visit: Payer: Self-pay | Admitting: Cardiology

## 2013-05-02 ENCOUNTER — Telehealth: Payer: Self-pay | Admitting: *Deleted

## 2013-05-03 MED ORDER — ATENOLOL 25 MG PO TABS: 25.0000 mg | ORAL_TABLET | Freq: Every day | ORAL | Status: DC

## 2013-05-03 NOTE — Telephone Encounter (Signed)
Rx refill

## 2013-05-15 DIAGNOSIS — N952 Postmenopausal atrophic vaginitis: Secondary | ICD-10-CM | POA: Diagnosis not present

## 2013-05-15 DIAGNOSIS — N281 Cyst of kidney, acquired: Secondary | ICD-10-CM | POA: Diagnosis not present

## 2013-05-15 DIAGNOSIS — N3020 Other chronic cystitis without hematuria: Secondary | ICD-10-CM | POA: Diagnosis not present

## 2013-06-28 ENCOUNTER — Other Ambulatory Visit: Payer: Self-pay | Admitting: *Deleted

## 2013-06-28 DIAGNOSIS — Z79899 Other long term (current) drug therapy: Secondary | ICD-10-CM

## 2013-06-28 DIAGNOSIS — E782 Mixed hyperlipidemia: Secondary | ICD-10-CM

## 2013-07-11 ENCOUNTER — Other Ambulatory Visit: Payer: Medicare Other

## 2013-07-12 ENCOUNTER — Ambulatory Visit: Payer: Medicare Other | Admitting: Cardiology

## 2013-07-23 ENCOUNTER — Encounter: Payer: Self-pay | Admitting: Cardiology

## 2013-07-23 ENCOUNTER — Encounter: Payer: Self-pay | Admitting: *Deleted

## 2013-07-23 DIAGNOSIS — R079 Chest pain, unspecified: Secondary | ICD-10-CM | POA: Insufficient documentation

## 2013-07-23 DIAGNOSIS — K219 Gastro-esophageal reflux disease without esophagitis: Secondary | ICD-10-CM | POA: Insufficient documentation

## 2013-07-23 DIAGNOSIS — E7800 Pure hypercholesterolemia, unspecified: Secondary | ICD-10-CM | POA: Insufficient documentation

## 2013-07-23 DIAGNOSIS — I2510 Atherosclerotic heart disease of native coronary artery without angina pectoris: Secondary | ICD-10-CM | POA: Insufficient documentation

## 2013-07-23 DIAGNOSIS — I10 Essential (primary) hypertension: Secondary | ICD-10-CM | POA: Insufficient documentation

## 2013-07-23 DIAGNOSIS — R202 Paresthesia of skin: Secondary | ICD-10-CM | POA: Insufficient documentation

## 2013-07-23 DIAGNOSIS — I739 Peripheral vascular disease, unspecified: Secondary | ICD-10-CM | POA: Insufficient documentation

## 2013-07-23 DIAGNOSIS — K635 Polyp of colon: Secondary | ICD-10-CM | POA: Insufficient documentation

## 2013-07-23 DIAGNOSIS — I6529 Occlusion and stenosis of unspecified carotid artery: Secondary | ICD-10-CM | POA: Insufficient documentation

## 2013-07-23 DIAGNOSIS — M1990 Unspecified osteoarthritis, unspecified site: Secondary | ICD-10-CM | POA: Insufficient documentation

## 2013-07-23 DIAGNOSIS — I4510 Unspecified right bundle-branch block: Secondary | ICD-10-CM | POA: Insufficient documentation

## 2013-07-23 DIAGNOSIS — F4321 Adjustment disorder with depressed mood: Secondary | ICD-10-CM | POA: Insufficient documentation

## 2013-07-23 DIAGNOSIS — K589 Irritable bowel syndrome without diarrhea: Secondary | ICD-10-CM | POA: Insufficient documentation

## 2013-07-23 DIAGNOSIS — R002 Palpitations: Secondary | ICD-10-CM | POA: Insufficient documentation

## 2013-07-23 DIAGNOSIS — M542 Cervicalgia: Secondary | ICD-10-CM | POA: Insufficient documentation

## 2013-07-24 ENCOUNTER — Other Ambulatory Visit (INDEPENDENT_AMBULATORY_CARE_PROVIDER_SITE_OTHER): Payer: Medicare Other

## 2013-07-24 DIAGNOSIS — Z79899 Other long term (current) drug therapy: Secondary | ICD-10-CM | POA: Diagnosis not present

## 2013-07-24 DIAGNOSIS — E782 Mixed hyperlipidemia: Secondary | ICD-10-CM

## 2013-07-24 LAB — HEPATIC FUNCTION PANEL
ALBUMIN: 3.6 g/dL (ref 3.5–5.2)
ALK PHOS: 41 U/L (ref 39–117)
ALT: 18 U/L (ref 0–35)
AST: 28 U/L (ref 0–37)
Bilirubin, Direct: 0 mg/dL (ref 0.0–0.3)
TOTAL PROTEIN: 6.8 g/dL (ref 6.0–8.3)
Total Bilirubin: 0.9 mg/dL (ref 0.3–1.2)

## 2013-07-24 LAB — LIPID PANEL
CHOLESTEROL: 163 mg/dL (ref 0–200)
HDL: 61.8 mg/dL (ref >39.00)
LDL CALC: 84 mg/dL (ref 0–99)
TRIGLYCERIDES: 86 mg/dL (ref 0.0–149.0)
Total CHOL/HDL Ratio: 3
VLDL: 17.2 mg/dL (ref 0.0–40.0)

## 2013-07-26 ENCOUNTER — Encounter (INDEPENDENT_AMBULATORY_CARE_PROVIDER_SITE_OTHER): Payer: Self-pay

## 2013-07-26 ENCOUNTER — Ambulatory Visit (INDEPENDENT_AMBULATORY_CARE_PROVIDER_SITE_OTHER): Payer: Medicare Other | Admitting: Cardiology

## 2013-07-26 ENCOUNTER — Encounter: Payer: Self-pay | Admitting: Cardiology

## 2013-07-26 VITALS — BP 142/71 | HR 64 | Ht 64.0 in | Wt 146.0 lb

## 2013-07-26 DIAGNOSIS — I471 Supraventricular tachycardia: Secondary | ICD-10-CM

## 2013-07-26 DIAGNOSIS — I4719 Other supraventricular tachycardia: Secondary | ICD-10-CM | POA: Insufficient documentation

## 2013-07-26 DIAGNOSIS — L659 Nonscarring hair loss, unspecified: Secondary | ICD-10-CM | POA: Diagnosis not present

## 2013-07-26 DIAGNOSIS — I10 Essential (primary) hypertension: Secondary | ICD-10-CM

## 2013-07-26 DIAGNOSIS — I2510 Atherosclerotic heart disease of native coronary artery without angina pectoris: Secondary | ICD-10-CM | POA: Diagnosis not present

## 2013-07-26 DIAGNOSIS — E7800 Pure hypercholesterolemia, unspecified: Secondary | ICD-10-CM | POA: Insufficient documentation

## 2013-07-26 MED ORDER — DILTIAZEM HCL 120 MG PO TABS: 120.0000 mg | ORAL_TABLET | Freq: Every day | ORAL | Status: DC

## 2013-07-26 NOTE — Progress Notes (Signed)
[name]. [address] [place],   [address] Phone: [phone] Fax:  [phone]  Date:  07/26/2013   ID:  [name], DOB 10-Jul-1935, MRN [id number]  PCP:  Henrine Screws, MD   History of Present Illness: [name] is a 78 y.o. female with paroxysmal atrial tachycardia, no evidence of significant coronary artery disease on catheterization in 2012, here for followup. She stopped diltiazem in the past because of constipation. Atenolol has been used. This has helped with her palpitations. Her cholesterol is currently reasonably/very well controlled with very low dose Crestor. Previous stabbing pain in legs. Her last LDL is 83 (7/13). She does have mild carotid artery disease on last check, 20%. Was walking and felt a funny feeling in chest.  Has had only mild palpitations. Unfortunately still grieving the loss of her husband who died in Feb 19, 2013. She's also noticed some hair loss with atenolol. We have decided to stop this and start low-dose calcium channel blocker.    Wt Readings from Last 3 Encounters:  07/26/13 146 lb (66.225 kg)     Past Medical History  Diagnosis Date  . Hypertension   . Hypercholesteremia   . Colon polyp   . Occlusion and stenosis of carotid artery without mention of cerebral infarction   . CAD (coronary artery disease)   . Chest pain   . GERD (gastroesophageal reflux disease)   . IBS (irritable bowel syndrome)   . Paresthesia   . Neck pain   . Palpitation   . RBBB   . Situational depression   . DJD (degenerative joint disease)   . PVD (peripheral vascular disease)     Past Surgical History  Procedure Laterality Date  . Breast biopsy    . Tonsillectomy    . Tubal ligation    . Cholecystectomy    . Appendectomy      Current Outpatient Prescriptions  Medication Sig Dispense Refill  . aspirin 81 MG tablet Take 81 mg by mouth daily.      Marland Kitchen aspirin-acetaminophen-caffeine (EXCEDRIN MIGRAINE) 250-250-65 MG per  tablet Take by mouth every 6 (six) hours as needed for headache.      Marland Kitchen atenolol (TENORMIN) 25 MG tablet TAKE 1 TABLET EVERY DAY  90 tablet  4  . Cholecalciferol (VITAMIN D) 400 UNITS capsule Take 400 Units by mouth daily.      [name] Sodium (COLACE PO) Take by mouth.      Marland Kitchen lisinopril-hydrochlorothiazide (PRINZIDE,ZESTORETIC) 20-12.5 MG per tablet TAKE 1/2 TABLET TWICE A DAY  90 tablet  2  . methocarbamol (ROBAXIN) 500 MG tablet Take 500 mg by mouth every 6 (six) hours as needed.       . Multiple Vitamin (MULTIVITAMIN) tablet Take 1 tablet by mouth daily.      . nitroGLYCERIN (NITROSTAT) 0.4 MG SL tablet Place 0.4 mg under the tongue every 5 (five) minutes as needed for chest pain.      . rosuvastatin (CRESTOR) 5 MG tablet Take 5 mg by mouth daily. 1/4 pill daily       No current facility-administered medications for this visit.    Allergies:    Allergies  Allergen Reactions  . Ceftin [Cefuroxime Axetil]     Double vision and joint pain  . Ciprofloxacin Hcl     Pain in stomach, bleeding.    Social History:  The patient  reports that she has quit smoking. She does not have any smokeless tobacco  history on file.   ROS:  Please see the history of present illness.   No CP, no bleeding, no chest pain, no orthopnea    PHYSICAL EXAM: VS:  BP 142/71  Pulse 64  Ht 5\' 4"  (1.626 m)  Wt 146 lb (66.225 kg)  BMI 25.05 kg/m2 Well nourished, well developed, in no acute distress HEENT: normal Neck: no JVD Cardiac:  normal S1, S2; RRR; no murmur Lungs:  clear to auscultation bilaterally, no wheezing, rhonchi or rales Abd: soft, nontender, no hepatomegaly Ext: no edema Skin: warm and dry Neuro: no focal abnormalities noted  EKG:  07/27/11-sinus rhythm rate 64, right bundle branch block, chronic    Labs: 07/24/13-ALT 18, LDL 84.  ASSESSMENT AND PLAN:  1. Paroxysmal atrial tachycardia-I will stop atenolol because of hair loss and tried diltiazem CD 120 mg once a day. 2. Hair  loss-stopping atenolol in case this is causing. 3. Hypertension-fairly well controlled. Previously normal. 4. Hyperlipidemia-extremely low-dose Crestor. LDL 84 on 07/24/13. Liver functions normal. We will repeat in one year. 5. Grief reaction-still mourning the loss of her husband from August of 2014. She did take anxiolytic surrounding his death. May need to talk to Dr. Inda Merlin again.  Signed, Candee Furbish, MD [facility]  07/26/2013 10:11 AM

## 2013-07-26 NOTE — Patient Instructions (Signed)
Your physician has recommended you make the following change in your medication:   1. Stop Atenolol 2. Start Diltiazem 120mg  once daily.  Your physician wants you to follow-up in: 6 months with Dr. Marlou Porch. You will receive a reminder letter in the mail two months in advance. If you don't receive a letter, please call our office to schedule the follow-up appointment.

## 2013-07-31 ENCOUNTER — Telehealth: Payer: Self-pay | Admitting: Cardiology

## 2013-07-31 MED ORDER — NEBIVOLOL HCL 5 MG PO TABS
5.0000 mg | ORAL_TABLET | Freq: Every day | ORAL | Status: DC
Start: 2013-07-31 — End: 2014-05-14

## 2013-07-31 NOTE — Telephone Encounter (Signed)
New message    C/o side effect from medication , headache every day , ankle swelling more than normal

## 2013-07-31 NOTE — Telephone Encounter (Signed)
Elevated Blood pressure, faced flushed on one side and headaches, will frequent swelling in legs more than usual, since she started Diltiazem 120 QD started . Spoke with Dr. Marlou Porch stop Diltiazem and start Bystolic 5 mg once daily.

## 2013-08-01 NOTE — Telephone Encounter (Signed)
Spoke with patient patient aware of medication change to stop Diltiazem and start Bystolic 5 mg QD

## 2013-08-21 ENCOUNTER — Other Ambulatory Visit: Payer: Self-pay

## 2013-08-21 DIAGNOSIS — Z1231 Encounter for screening mammogram for malignant neoplasm of breast: Secondary | ICD-10-CM

## 2013-09-03 ENCOUNTER — Telehealth: Payer: Self-pay

## 2013-09-03 NOTE — Telephone Encounter (Signed)
Forwarded to Dr. Marlou Porch

## 2013-09-03 NOTE — Telephone Encounter (Signed)
I am fine with her continuing with her atenolol. No Bystolic.

## 2013-09-05 ENCOUNTER — Telehealth: Payer: Self-pay

## 2013-09-05 ENCOUNTER — Ambulatory Visit
Admission: RE | Admit: 2013-09-05 | Discharge: 2013-09-05 | Disposition: A | Discharge status: Home / Self Care | Payer: Medicare Other | Source: Ambulatory Visit

## 2013-09-05 DIAGNOSIS — Z1231 Encounter for screening mammogram for malignant neoplasm of breast: Secondary | ICD-10-CM

## 2013-09-05 NOTE — Telephone Encounter (Signed)
Stop atenolol and start diltiazem CD 120 mg once a day. Thank you

## 2013-09-05 NOTE — Telephone Encounter (Signed)
I called the patient to let her know that it was ok for her to stay on the atenolol instead of bystolic , she said that she wanted something else that will not make her hair fall out

## 2013-09-06 ENCOUNTER — Telehealth: Payer: Self-pay

## 2013-09-06 NOTE — Telephone Encounter (Signed)
She also stated that it made her BP too high

## 2013-09-06 NOTE — Telephone Encounter (Signed)
error 

## 2013-09-06 NOTE — Telephone Encounter (Signed)
Called patient about switching to diltiazem CD 120 mg, she states that she took this in January and she just did not feel right

## 2013-09-06 NOTE — Telephone Encounter (Signed)
To Dr. Skains, please advise. 

## 2013-09-07 NOTE — Telephone Encounter (Signed)
See 09/05/13 telephone note.

## 2013-09-10 NOTE — Telephone Encounter (Signed)
The alternative is just to continue with atenolol. Could try Bystolic 5 mg instead of atenolol however this may be more expensive. Please offer her this.

## 2013-09-11 NOTE — Telephone Encounter (Signed)
Called patient about switching to bystolic 5 mg or staying with atenlol . She said that she would stay on the atenlol

## 2013-10-10 DIAGNOSIS — L219 Seborrheic dermatitis, unspecified: Secondary | ICD-10-CM | POA: Diagnosis not present

## 2013-10-10 DIAGNOSIS — L821 Other seborrheic keratosis: Secondary | ICD-10-CM | POA: Diagnosis not present

## 2013-10-10 DIAGNOSIS — L819 Disorder of pigmentation, unspecified: Secondary | ICD-10-CM | POA: Diagnosis not present

## 2013-10-10 DIAGNOSIS — B009 Herpesviral infection, unspecified: Secondary | ICD-10-CM | POA: Diagnosis not present

## 2013-10-10 DIAGNOSIS — Z85828 Personal history of other malignant neoplasm of skin: Secondary | ICD-10-CM | POA: Diagnosis not present

## 2013-10-10 DIAGNOSIS — D234 Other benign neoplasm of skin of scalp and neck: Secondary | ICD-10-CM | POA: Diagnosis not present

## 2013-10-25 ENCOUNTER — Other Ambulatory Visit: Payer: Self-pay | Admitting: Internal Medicine

## 2013-10-25 ENCOUNTER — Encounter (INDEPENDENT_AMBULATORY_CARE_PROVIDER_SITE_OTHER): Payer: Self-pay

## 2013-10-25 ENCOUNTER — Ambulatory Visit
Admission: RE | Admit: 2013-10-25 | Discharge: 2013-10-25 | Disposition: A | Discharge status: Home / Self Care | Payer: Medicare Other | Source: Ambulatory Visit | Attending: Internal Medicine | Admitting: Internal Medicine

## 2013-10-25 DIAGNOSIS — R109 Unspecified abdominal pain: Secondary | ICD-10-CM

## 2013-10-25 DIAGNOSIS — R3129 Other microscopic hematuria: Secondary | ICD-10-CM | POA: Diagnosis not present

## 2013-10-25 DIAGNOSIS — R1032 Left lower quadrant pain: Secondary | ICD-10-CM | POA: Diagnosis not present

## 2013-10-25 DIAGNOSIS — K5900 Constipation, unspecified: Secondary | ICD-10-CM | POA: Diagnosis not present

## 2013-10-30 ENCOUNTER — Ambulatory Visit
Admission: RE | Admit: 2013-10-30 | Discharge: 2013-10-30 | Disposition: A | Discharge status: Home / Self Care | Payer: Medicare Other | Source: Ambulatory Visit | Attending: Internal Medicine | Admitting: Internal Medicine

## 2013-10-30 DIAGNOSIS — N281 Cyst of kidney, acquired: Secondary | ICD-10-CM | POA: Diagnosis not present

## 2013-10-30 DIAGNOSIS — R109 Unspecified abdominal pain: Secondary | ICD-10-CM

## 2013-11-04 DIAGNOSIS — N644 Mastodynia: Secondary | ICD-10-CM | POA: Diagnosis not present

## 2013-11-05 ENCOUNTER — Other Ambulatory Visit: Payer: Self-pay | Admitting: Internal Medicine

## 2013-11-05 DIAGNOSIS — N644 Mastodynia: Secondary | ICD-10-CM

## 2013-11-07 DIAGNOSIS — R109 Unspecified abdominal pain: Secondary | ICD-10-CM | POA: Diagnosis not present

## 2013-11-07 DIAGNOSIS — N644 Mastodynia: Secondary | ICD-10-CM | POA: Diagnosis not present

## 2013-11-07 DIAGNOSIS — K5900 Constipation, unspecified: Secondary | ICD-10-CM | POA: Diagnosis not present

## 2013-11-07 DIAGNOSIS — R3129 Other microscopic hematuria: Secondary | ICD-10-CM | POA: Diagnosis not present

## 2013-11-07 DIAGNOSIS — R1032 Left lower quadrant pain: Secondary | ICD-10-CM | POA: Diagnosis not present

## 2013-11-15 ENCOUNTER — Ambulatory Visit
Admission: RE | Admit: 2013-11-15 | Discharge: 2013-11-15 | Disposition: A | Discharge status: Home / Self Care | Payer: Medicare Other | Source: Ambulatory Visit | Attending: Internal Medicine | Admitting: Internal Medicine

## 2013-11-15 DIAGNOSIS — N644 Mastodynia: Secondary | ICD-10-CM

## 2013-11-29 NOTE — Telephone Encounter (Signed)
Will close encounter

## 2013-12-05 ENCOUNTER — Other Ambulatory Visit: Payer: Self-pay

## 2013-12-05 MED ORDER — ROSUVASTATIN CALCIUM 5 MG PO TABS: ORAL_TABLET | ORAL | Status: DC

## 2013-12-10 DIAGNOSIS — N644 Mastodynia: Secondary | ICD-10-CM | POA: Diagnosis not present

## 2013-12-25 ENCOUNTER — Ambulatory Visit (INDEPENDENT_AMBULATORY_CARE_PROVIDER_SITE_OTHER): Payer: Medicare Other | Admitting: General Surgery

## 2013-12-25 ENCOUNTER — Encounter (INDEPENDENT_AMBULATORY_CARE_PROVIDER_SITE_OTHER): Payer: Self-pay | Admitting: General Surgery

## 2013-12-25 VITALS — BP 134/74 | HR 80 | Temp 98.0°F | Ht 64.0 in | Wt 152.0 lb

## 2013-12-25 DIAGNOSIS — N644 Mastodynia: Secondary | ICD-10-CM | POA: Diagnosis not present

## 2013-12-25 NOTE — Progress Notes (Signed)
Patient ID: [name], female   DOB: Aug 02, 1935, 78 y.o.   MRN: [id number]  Chief Complaint  Patient presents with  . eval right breast    HPI [name] is a 78 y.o. female.  Referred by Dr Dory Horn HPI This is a 78 year old female who has a prior history of a right that biopsy by one of my partners to rule out Paget's disease. In May she began having right breast pain that was an acute onset when she was taking her bra off. For several days she even wore her bra in the shower she couldn't even let the breast sit down. She went to an after hours clinic and was treated with antibiotics. This was with no help. The pain has gotten better over this time. She is able to go without her bra now. She has no nipple discharge. She previously has an inverted nipple. There is no history of any trauma. The mammogram that was done in May 21 was negative. She was sent over by Dr. Nori Riis for evaluation. She still has some pain with palpation. She has no prior history of any breast disease and no family history of breast cancer.  Past Medical History  Diagnosis Date  . Hypertension   . Hypercholesteremia   . Colon polyp   . Occlusion and stenosis of carotid artery without mention of cerebral infarction   . CAD (coronary artery disease)   . Chest pain   . GERD (gastroesophageal reflux disease)   . IBS (irritable bowel syndrome)   . Paresthesia   . Neck pain   . Palpitation   . RBBB   . Situational depression   . DJD (degenerative joint disease)   . PVD (peripheral vascular disease)     Past Surgical History  Procedure Laterality Date  . Breast biopsy    . Tonsillectomy    . Tubal ligation    . Cholecystectomy    . Appendectomy      Family History  Problem Relation Age of Onset  . Heart attack Father   . Kidney disease Father     Social History History  Substance Use Topics  . Smoking status: Former Research scientist (life sciences)  . Smokeless tobacco: Not on file  . Alcohol Use: No    Allergies    Allergen Reactions  . Ceftin [Cefuroxime Axetil]     Double vision and joint pain  . Ciprofloxacin Hcl     Pain in stomach, bleeding.    Current Outpatient Prescriptions  Medication Sig Dispense Refill  . aspirin 81 MG tablet Take 81 mg by mouth daily.      Marland Kitchen aspirin-acetaminophen-caffeine (EXCEDRIN MIGRAINE) 250-250-65 MG per tablet Take by mouth every 6 (six) hours as needed for headache.      . Cholecalciferol (VITAMIN D) 400 UNITS capsule Take 400 Units by mouth daily.      [name] Sodium (COLACE PO) Take by mouth.      Marland Kitchen lisinopril-hydrochlorothiazide (PRINZIDE,ZESTORETIC) 20-12.5 MG per tablet TAKE 1/2 TABLET TWICE A DAY  90 tablet  2  . methocarbamol (ROBAXIN) 500 MG tablet Take 500 mg by mouth every 6 (six) hours as needed.       . Multiple Vitamin (MULTIVITAMIN) tablet Take 1 tablet by mouth daily.      . nebivolol (BYSTOLIC) 5 MG tablet Take 1 tablet (5 mg total) by mouth daily.  30 tablet  5  . nitroGLYCERIN (NITROSTAT) 0.4 MG SL tablet Place 0.4 mg under the tongue every  5 (five) minutes as needed for chest pain.      . rosuvastatin (CRESTOR) 5 MG tablet Take 1/4 pill daily  30 tablet  3   No current facility-administered medications for this visit.    Review of Systems Review of Systems  Constitutional: Negative for fever, chills and unexpected weight change.  HENT: Negative for congestion, hearing loss, sore throat, trouble swallowing and voice change.   Eyes: Negative for visual disturbance.  Respiratory: Negative for cough and wheezing.   Cardiovascular: Negative for chest pain, palpitations and leg swelling.  Gastrointestinal: Negative for nausea, vomiting, abdominal pain, diarrhea, constipation, blood in stool, abdominal distention and anal bleeding.  Genitourinary: Negative for hematuria, vaginal bleeding and difficulty urinating.  Musculoskeletal: Negative for arthralgias.  Skin: Negative for rash and wound.  Neurological: Negative for seizures, syncope and  headaches.  Hematological: Negative for adenopathy. Does not bruise/bleed easily.  Psychiatric/Behavioral: Negative for confusion.    Blood pressure 134/74, pulse 80, temperature 98 F (36.7 C), height 5\' 4"  (1.626 m), weight 152 lb (68.947 kg).  Physical Exam Physical Exam  Vitals reviewed. Constitutional: She appears well-developed and well-nourished.  Neck: Neck supple.  Cardiovascular: Normal rate, regular rhythm and normal heart sounds.   Pulmonary/Chest: Effort normal and breath sounds normal. She has no wheezes. She has no rales. Right breast exhibits tenderness. Right breast exhibits no inverted nipple, no mass, no nipple discharge and no skin change. Left breast exhibits no inverted nipple, no mass, no nipple discharge, no skin change and no tenderness.    Lymphadenopathy:    She has no cervical adenopathy.    She has no axillary adenopathy.       Right: No supraclavicular adenopathy present.       Left: No supraclavicular adenopathy present.    Data Reviewed EXAM:  DIGITAL DIAGNOSTIC right MAMMOGRAM WITH CAD  COMPARISON: Prior exams, most recent bilateral exam 09/05/2013  ACR Breast Density Category b: There are scattered areas of  fibroglandular density.  FINDINGS:  No abnormality is identified in the right breast.  Mammographic images were processed with CAD.  IMPRESSION:  No evidence for malignancy in the right breast.  RECOMMENDATION:  Bilateral screening mammography is recommended March 2016  I have discussed the findings and recommendations with the patient.  Results were also provided in writing at the conclusion of the  visit. If applicable, a reminder letter will be sent to the patient  regarding the next appointment.  BI-RADS CATEGORY 1: Negative.    Assessment    Right breast tenderness     Plan    I think this will likely be a benign cause of breast tenderness. This is getting better on it so with no real specific therapy. I do feel some  thickening and she really does have a fairly discrete area that has tenderness present. I think he would be the best to get an ultrasound of this one specific area. If this is negative then I would be fine just following her as her symptoms are improving. She's going to get the ultrasound I will followup with her.       WAKEFIELD,MATTHEW 12/25/2013, 3:02 PM

## 2014-01-03 ENCOUNTER — Other Ambulatory Visit: Payer: Self-pay

## 2014-01-03 ENCOUNTER — Other Ambulatory Visit (INDEPENDENT_AMBULATORY_CARE_PROVIDER_SITE_OTHER): Payer: Self-pay | Admitting: General Surgery

## 2014-01-03 DIAGNOSIS — Z1331 Encounter for screening for depression: Secondary | ICD-10-CM | POA: Diagnosis not present

## 2014-01-03 DIAGNOSIS — N644 Mastodynia: Secondary | ICD-10-CM

## 2014-01-03 DIAGNOSIS — K5900 Constipation, unspecified: Secondary | ICD-10-CM | POA: Diagnosis not present

## 2014-01-03 DIAGNOSIS — Z0000 Encounter for general adult medical examination without abnormal findings: Secondary | ICD-10-CM | POA: Diagnosis not present

## 2014-01-03 DIAGNOSIS — M542 Cervicalgia: Secondary | ICD-10-CM | POA: Diagnosis not present

## 2014-01-03 DIAGNOSIS — E782 Mixed hyperlipidemia: Secondary | ICD-10-CM | POA: Diagnosis not present

## 2014-01-03 DIAGNOSIS — F4321 Adjustment disorder with depressed mood: Secondary | ICD-10-CM | POA: Diagnosis not present

## 2014-01-03 DIAGNOSIS — R3129 Other microscopic hematuria: Secondary | ICD-10-CM | POA: Diagnosis not present

## 2014-01-03 DIAGNOSIS — R109 Unspecified abdominal pain: Secondary | ICD-10-CM | POA: Diagnosis not present

## 2014-01-03 DIAGNOSIS — I10 Essential (primary) hypertension: Secondary | ICD-10-CM | POA: Diagnosis not present

## 2014-01-03 DIAGNOSIS — E559 Vitamin D deficiency, unspecified: Secondary | ICD-10-CM | POA: Diagnosis not present

## 2014-01-04 ENCOUNTER — Ambulatory Visit
Admission: RE | Admit: 2014-01-04 | Discharge: 2014-01-04 | Disposition: A | Discharge status: Home / Self Care | Payer: Medicare Other | Source: Ambulatory Visit | Attending: General Surgery | Admitting: General Surgery

## 2014-01-04 ENCOUNTER — Other Ambulatory Visit (INDEPENDENT_AMBULATORY_CARE_PROVIDER_SITE_OTHER): Payer: Self-pay | Admitting: General Surgery

## 2014-01-04 DIAGNOSIS — N644 Mastodynia: Secondary | ICD-10-CM

## 2014-01-04 DIAGNOSIS — R928 Other abnormal and inconclusive findings on diagnostic imaging of breast: Secondary | ICD-10-CM | POA: Diagnosis not present

## 2014-01-04 DIAGNOSIS — N6459 Other signs and symptoms in breast: Secondary | ICD-10-CM | POA: Diagnosis not present

## 2014-01-07 ENCOUNTER — Telehealth (INDEPENDENT_AMBULATORY_CARE_PROVIDER_SITE_OTHER): Payer: Self-pay

## 2014-01-07 NOTE — Telephone Encounter (Signed)
Called pt to advise her that Dr Donne Hazel looked over the mgm report from last week and he feels there is nothing else to do at this time since the exam,u/s,and mgm is fine. The pt feels fine with this plan and she knows to call us if any changes.

## 2014-01-07 NOTE — Telephone Encounter (Signed)
Message copied by Illene Regulus on Mon Jan 07, 2014 11:42 AM ------      Message from: O[place], [place]      Created: Sun Jan 06, 2014  9:40 PM       Reeves Forth think there is anything more to do. Exam was fine, ultrasound is fine, she was getting better when I saw her. Im fine seeing her back as needed if you would call and tell her this.  If she wants to come back that is ok.      MW      ----- Message -----         From: Rad Results In Interface         Sent: 01/04/2014   2:48 PM           To: Rolm Bookbinder, MD                   ------

## 2014-01-18 ENCOUNTER — Other Ambulatory Visit: Payer: Self-pay

## 2014-01-18 MED ORDER — LISINOPRIL-HYDROCHLOROTHIAZIDE 20-12.5 MG PO TABS: ORAL_TABLET | ORAL | Status: DC

## 2014-02-04 DIAGNOSIS — M2040 Other hammer toe(s) (acquired), unspecified foot: Secondary | ICD-10-CM | POA: Diagnosis not present

## 2014-02-04 DIAGNOSIS — M7150 Other bursitis, not elsewhere classified, unspecified site: Secondary | ICD-10-CM | POA: Diagnosis not present

## 2014-02-04 DIAGNOSIS — M898X9 Other specified disorders of bone, unspecified site: Secondary | ICD-10-CM | POA: Diagnosis not present

## 2014-02-04 DIAGNOSIS — M79609 Pain in unspecified limb: Secondary | ICD-10-CM | POA: Diagnosis not present

## 2014-02-19 DIAGNOSIS — H2000 Unspecified acute and subacute iridocyclitis: Secondary | ICD-10-CM | POA: Diagnosis not present

## 2014-02-21 DIAGNOSIS — H2000 Unspecified acute and subacute iridocyclitis: Secondary | ICD-10-CM | POA: Diagnosis not present

## 2014-03-06 DIAGNOSIS — M21619 Bunion of unspecified foot: Secondary | ICD-10-CM | POA: Diagnosis not present

## 2014-03-18 DIAGNOSIS — M899 Disorder of bone, unspecified: Secondary | ICD-10-CM | POA: Diagnosis not present

## 2014-03-18 DIAGNOSIS — M949 Disorder of cartilage, unspecified: Secondary | ICD-10-CM | POA: Diagnosis not present

## 2014-03-18 DIAGNOSIS — N390 Urinary tract infection, site not specified: Secondary | ICD-10-CM | POA: Diagnosis not present

## 2014-03-18 DIAGNOSIS — N959 Unspecified menopausal and perimenopausal disorder: Secondary | ICD-10-CM | POA: Diagnosis not present

## 2014-03-18 DIAGNOSIS — Z01419 Encounter for gynecological examination (general) (routine) without abnormal findings: Secondary | ICD-10-CM | POA: Diagnosis not present

## 2014-03-28 DIAGNOSIS — G609 Hereditary and idiopathic neuropathy, unspecified: Secondary | ICD-10-CM | POA: Diagnosis not present

## 2014-03-28 DIAGNOSIS — G5602 Carpal tunnel syndrome, left upper limb: Secondary | ICD-10-CM | POA: Diagnosis not present

## 2014-03-28 DIAGNOSIS — G603 Idiopathic progressive neuropathy: Secondary | ICD-10-CM | POA: Diagnosis not present

## 2014-03-28 DIAGNOSIS — R634 Abnormal weight loss: Secondary | ICD-10-CM | POA: Diagnosis not present

## 2014-03-28 DIAGNOSIS — R202 Paresthesia of skin: Secondary | ICD-10-CM | POA: Diagnosis not present

## 2014-03-28 DIAGNOSIS — M654 Radial styloid tenosynovitis [de Quervain]: Secondary | ICD-10-CM | POA: Diagnosis not present

## 2014-04-13 DIAGNOSIS — L309 Dermatitis, unspecified: Secondary | ICD-10-CM | POA: Diagnosis not present

## 2014-04-15 DIAGNOSIS — G5762 Lesion of plantar nerve, left lower limb: Secondary | ICD-10-CM | POA: Diagnosis not present

## 2014-04-15 DIAGNOSIS — M25572 Pain in left ankle and joints of left foot: Secondary | ICD-10-CM | POA: Diagnosis not present

## 2014-04-17 DIAGNOSIS — L309 Dermatitis, unspecified: Secondary | ICD-10-CM | POA: Diagnosis not present

## 2014-04-23 DIAGNOSIS — W57XXXA Bitten or stung by nonvenomous insect and other nonvenomous arthropods, initial encounter: Secondary | ICD-10-CM | POA: Diagnosis not present

## 2014-04-23 DIAGNOSIS — Z85828 Personal history of other malignant neoplasm of skin: Secondary | ICD-10-CM | POA: Diagnosis not present

## 2014-04-24 ENCOUNTER — Emergency Department (HOSPITAL_BASED_OUTPATIENT_CLINIC_OR_DEPARTMENT_OTHER): Payer: Medicare Other

## 2014-04-24 ENCOUNTER — Emergency Department (HOSPITAL_BASED_OUTPATIENT_CLINIC_OR_DEPARTMENT_OTHER)
Admission: EM | Admit: 2014-04-24 | Discharge: 2014-04-24 | Disposition: A | Discharge status: Home / Self Care | Payer: Medicare Other | Attending: Emergency Medicine | Admitting: Emergency Medicine

## 2014-04-24 ENCOUNTER — Encounter (HOSPITAL_BASED_OUTPATIENT_CLINIC_OR_DEPARTMENT_OTHER): Payer: Self-pay | Admitting: Emergency Medicine

## 2014-04-24 DIAGNOSIS — K922 Gastrointestinal hemorrhage, unspecified: Secondary | ICD-10-CM | POA: Diagnosis not present

## 2014-04-24 DIAGNOSIS — Z87891 Personal history of nicotine dependence: Secondary | ICD-10-CM | POA: Insufficient documentation

## 2014-04-24 DIAGNOSIS — Z9089 Acquired absence of other organs: Secondary | ICD-10-CM | POA: Diagnosis not present

## 2014-04-24 DIAGNOSIS — Z9851 Tubal ligation status: Secondary | ICD-10-CM | POA: Insufficient documentation

## 2014-04-24 DIAGNOSIS — K921 Melena: Secondary | ICD-10-CM

## 2014-04-24 DIAGNOSIS — M1990 Unspecified osteoarthritis, unspecified site: Secondary | ICD-10-CM | POA: Diagnosis not present

## 2014-04-24 DIAGNOSIS — Z86010 Personal history of colonic polyps: Secondary | ICD-10-CM | POA: Insufficient documentation

## 2014-04-24 DIAGNOSIS — Z8659 Personal history of other mental and behavioral disorders: Secondary | ICD-10-CM | POA: Diagnosis not present

## 2014-04-24 DIAGNOSIS — K644 Residual hemorrhoidal skin tags: Secondary | ICD-10-CM | POA: Diagnosis not present

## 2014-04-24 DIAGNOSIS — I2510 Atherosclerotic heart disease of native coronary artery without angina pectoris: Secondary | ICD-10-CM | POA: Insufficient documentation

## 2014-04-24 DIAGNOSIS — K219 Gastro-esophageal reflux disease without esophagitis: Secondary | ICD-10-CM | POA: Insufficient documentation

## 2014-04-24 DIAGNOSIS — I10 Essential (primary) hypertension: Secondary | ICD-10-CM | POA: Diagnosis not present

## 2014-04-24 DIAGNOSIS — Z79899 Other long term (current) drug therapy: Secondary | ICD-10-CM | POA: Diagnosis not present

## 2014-04-24 DIAGNOSIS — E780 Pure hypercholesterolemia: Secondary | ICD-10-CM | POA: Diagnosis not present

## 2014-04-24 DIAGNOSIS — R55 Syncope and collapse: Secondary | ICD-10-CM | POA: Diagnosis not present

## 2014-04-24 DIAGNOSIS — Z7982 Long term (current) use of aspirin: Secondary | ICD-10-CM | POA: Insufficient documentation

## 2014-04-24 DIAGNOSIS — K589 Irritable bowel syndrome without diarrhea: Secondary | ICD-10-CM | POA: Insufficient documentation

## 2014-04-24 DIAGNOSIS — R197 Diarrhea, unspecified: Secondary | ICD-10-CM | POA: Diagnosis not present

## 2014-04-24 DIAGNOSIS — K5730 Diverticulosis of large intestine without perforation or abscess without bleeding: Secondary | ICD-10-CM | POA: Diagnosis not present

## 2014-04-24 DIAGNOSIS — K625 Hemorrhage of anus and rectum: Secondary | ICD-10-CM | POA: Diagnosis not present

## 2014-04-24 DIAGNOSIS — I739 Peripheral vascular disease, unspecified: Secondary | ICD-10-CM | POA: Insufficient documentation

## 2014-04-24 LAB — COMPREHENSIVE METABOLIC PANEL
ALBUMIN: 3.7 g/dL (ref 3.5–5.2)
ALK PHOS: 74 U/L (ref 39–117)
ALT: 20 U/L (ref 0–35)
AST: 23 U/L (ref 0–37)
Anion gap: 12 (ref 5–15)
BILIRUBIN TOTAL: 0.4 mg/dL (ref 0.3–1.2)
BUN: 16 mg/dL (ref 6–23)
CHLORIDE: 100 meq/L (ref 96–112)
CO2: 28 mEq/L (ref 19–32)
Calcium: 10.1 mg/dL (ref 8.4–10.5)
Creatinine, Ser: 0.7 mg/dL (ref 0.50–1.10)
GFR calc Af Amer: >90 mL/min (ref >90)
GFR calc non Af Amer: 82 mL/min — ABNORMAL LOW (ref >90)
Glucose, Bld: 106 mg/dL — ABNORMAL HIGH (ref 70–99)
POTASSIUM: 4 meq/L (ref 3.7–5.3)
Sodium: 140 mEq/L (ref 137–147)
Total Protein: 7 g/dL (ref 6.0–8.3)

## 2014-04-24 LAB — CBC
HCT: 40.1 % (ref 36.0–46.0)
Hemoglobin: 13.9 g/dL (ref 12.0–15.0)
MCH: 29.5 pg (ref 26.0–34.0)
MCHC: 34.7 g/dL (ref 30.0–36.0)
MCV: 85.1 fL (ref 78.0–100.0)
Platelets: 262 10*3/uL (ref 150–400)
RBC: 4.71 MIL/uL (ref 3.87–5.11)
RDW: 13 % (ref 11.5–15.5)
WBC: 11.1 10*3/uL — AB (ref 4.0–10.5)

## 2014-04-24 LAB — OCCULT BLOOD X 1 CARD TO LAB, STOOL: FECAL OCCULT BLD: NEGATIVE

## 2014-04-24 MED ORDER — IOHEXOL 300 MG/ML  SOLN
25.0000 mL | Freq: Once | INTRAMUSCULAR | Status: AC | PRN
  Administered 2014-04-24: 25 mL via ORAL

## 2014-04-24 MED ORDER — LOPERAMIDE HCL 2 MG PO CAPS: 2.0000 mg | ORAL_CAPSULE | Freq: Four times a day (QID) | ORAL | Status: DC | PRN

## 2014-04-24 MED ORDER — IOHEXOL 300 MG/ML  SOLN
100.0000 mL | Freq: Once | INTRAMUSCULAR | Status: AC | PRN
  Administered 2014-04-24: 100 mL via INTRAVENOUS

## 2014-04-24 MED ORDER — SODIUM CHLORIDE 0.9 % IV BOLUS (SEPSIS)
1000.0000 mL | Freq: Once | INTRAVENOUS | Status: AC
  Administered 2014-04-24: 1000 mL via INTRAVENOUS

## 2014-04-24 NOTE — ED Provider Notes (Signed)
CSN: [id number]     Arrival date & time 04/24/14  1232 History   First MD Initiated Contact with Patient 04/24/14 1250     Chief Complaint  Patient presents with  . Rectal Bleeding     (Consider location/radiation/quality/duration/timing/severity/associated sxs/prior Treatment) HPI Comments: This is a 78 year old female with a past medical history of GERD, peripheral vascular disease, hypertension, hypercholesterolemia, colon polyps, CAD and IBS who presents to the emergency department after having an episode of rectal bleeding around 9:30 AM today and again at 11:30 AM today. Patient reports she started to feel very sweaty and lightheaded as if she were going to pass out, felt like she needed to move her bowels, and went to the bathroom and had to strain to have her bowel movements. She reports she was not constipated. When she wiped she noticed there was bright red blood on the toilet tissue, she looked into the toilet and noticed a clot of bright red blood with diarrhea. Denies nausea or vomiting. She recently completed an eight-day course of prednisone from October 17-25, followed by doxycycline for a rash on her abdomen. She tried to call her PCP today about the bloody stool, however she could not get an appointment and they advised her to go to the emergency department. Currently she denies abdominal pain, rectal pain, lightheadedness, dizziness or weakness.  Patient is a 78 y.o. female presenting with hematochezia. The history is provided by the patient.  Rectal Bleeding   Past Medical History  Diagnosis Date  . Hypertension   . Hypercholesteremia   . Colon polyp   . Occlusion and stenosis of carotid artery without mention of cerebral infarction   . CAD (coronary artery disease)   . Chest pain   . GERD (gastroesophageal reflux disease)   . IBS (irritable bowel syndrome)   . Paresthesia   . Neck pain   . Palpitation   . RBBB   . Situational depression   . DJD (degenerative joint  disease)   . PVD (peripheral vascular disease)    Past Surgical History  Procedure Laterality Date  . Breast biopsy    . Tonsillectomy    . Tubal ligation    . Cholecystectomy    . Appendectomy     Family History  Problem Relation Age of Onset  . Heart attack Father   . Kidney disease Father    History  Substance Use Topics  . Smoking status: Former Research scientist (life sciences)  . Smokeless tobacco: Not on file  . Alcohol Use: No   OB History   Grav Para Term Preterm Abortions TAB SAB Ect Mult Living                 Review of Systems  Gastrointestinal: Positive for hematochezia.    10 Systems reviewed and are negative for acute change except as noted in the HPI.   Allergies  Ceftin and Ciprofloxacin hcl  Home Medications   Prior to Admission medications   Medication Sig Start Date End Date Taking? Authorizing Provider  acyclovir ointment (ZOVIRAX) 5 % Apply 1 application topically every 3 (three) hours.   Yes Historical Provider, MD  atenolol (TENORMIN) 25 MG tablet Take by mouth daily.   Yes Historical Provider, MD  lansoprazole (PREVACID) 15 MG capsule Take 15 mg by mouth every other day.   Yes Historical Provider, MD  loratadine (CLARITIN) 10 MG tablet Take 10 mg by mouth daily.   Yes Historical Provider, MD  Omega-3 Fatty Acids (FISH OIL PO) Take  by mouth.   Yes Historical Provider, MD  Psyllium (METAMUCIL PO) Take by mouth.   Yes Historical Provider, MD  aspirin 81 MG tablet Take 81 mg by mouth daily.    Historical Provider, MD  aspirin-acetaminophen-caffeine (EXCEDRIN MIGRAINE) [phone] MG per tablet Take by mouth every 6 (six) hours as needed for headache.    Historical Provider, MD  Cholecalciferol (VITAMIN D) 400 UNITS capsule Take 400 Units by mouth daily.    Historical Provider, MD  Docusate Sodium (COLACE PO) Take by mouth.    Historical Provider, MD  lisinopril-hydrochlorothiazide (PRINZIDE,ZESTORETIC) 20-12.5 MG per tablet TAKE 1/2 TABLET TWICE A DAY 01/18/14   Candee Furbish, MD  methocarbamol (ROBAXIN) 500 MG tablet Take 500 mg by mouth every 6 (six) hours as needed.     Historical Provider, MD  Multiple Vitamin (MULTIVITAMIN) tablet Take 1 tablet by mouth daily.    Historical Provider, MD  nebivolol (BYSTOLIC) 5 MG tablet Take 1 tablet (5 mg total) by mouth daily. 07/31/13   Candee Furbish, MD  nitroGLYCERIN (NITROSTAT) 0.4 MG SL tablet Place 0.4 mg under the tongue every 5 (five) minutes as needed for chest pain.    Historical Provider, MD  rosuvastatin (CRESTOR) 5 MG tablet Take 1/4 pill daily 12/05/13   Candee Furbish, MD   BP 155/65  Pulse 63  Temp(Src) 98 F (36.7 C) (Oral)  Resp 20  Ht 5\' 4"  (1.626 m)  Wt 152 lb (68.947 kg)  BMI 26.08 kg/m2  SpO2 100% Physical Exam  Nursing note and vitals reviewed. Constitutional: She is oriented to person, place, and time. She appears well-developed and well-nourished. No distress.  HENT:  Head: Normocephalic and atraumatic.  Mouth/Throat: Oropharynx is clear and moist.  Eyes: Conjunctivae are normal.  Neck: Normal range of motion. Neck supple.  Cardiovascular: Normal rate, regular rhythm and normal heart sounds.   Pulmonary/Chest: Effort normal and breath sounds normal.  Abdominal: Soft. Bowel sounds are normal. There is no tenderness.  Genitourinary: Rectal exam shows external hemorrhoid. Rectal exam shows no internal hemorrhoid, no fissure, no tenderness and anal tone normal. Guaiac negative stool.  2 external hemorrhoids, no thrombosis or bleeding, no tenderness. No gross blood.  Musculoskeletal: Normal range of motion. She exhibits no edema.  Neurological: She is alert and oriented to person, place, and time.  Skin: Skin is warm and dry. She is not diaphoretic. No pallor.  Psychiatric: She has a normal mood and affect. Her behavior is normal.    ED Course  Procedures (including critical care time) Labs Review Labs Reviewed  CBC - Abnormal; Notable for the following:    WBC 11.1 (*)    All other  components within normal limits  COMPREHENSIVE METABOLIC PANEL - Abnormal; Notable for the following:    Glucose, Bld 106 (*)    GFR calc non Af Amer 82 (*)    All other components within normal limits  OCCULT BLOOD X 1 CARD TO LAB, STOOL    Imaging Review Dg Chest 2 View  04/24/2014   CLINICAL DATA:  Diarrhea with rectal bleeding.  Near syncope.  EXAM: CHEST  2 VIEW  COMPARISON:  03/15/2011  FINDINGS: The heart size and mediastinal contours are within normal limits. Both lungs are clear. The visualized skeletal structures are unremarkable.  IMPRESSION: No active cardiopulmonary disease.   Electronically Signed   By: Rolm Baptise M.D.   On: 04/24/2014 15:24   Ct Abdomen Pelvis W Contrast  04/24/2014   CLINICAL DATA:  Diarrhea,  rectal bleeding since this morning.  EXAM: CT ABDOMEN AND PELVIS WITH CONTRAST  TECHNIQUE: Multidetector CT imaging of the abdomen and pelvis was performed using the standard protocol following bolus administration of intravenous contrast.  CONTRAST:  153mL OMNIPAQUE IOHEXOL 300 MG/ML SOLN, 37mL OMNIPAQUE IOHEXOL 300 MG/ML SOLN  COMPARISON:  01/14/2011 Lung bases are clear. No effusions. Heart is normal size.  Small hypodensities scattered throughout the liver scratch has small hypodensity in the anterior left hepatic lobe scratch has small hypodensities in the anterior left hepatic lobe are stable, likely small cysts. Prior cholecystectomy. Spleen, pancreas, adrenals and left kidney are unremarkable. Simple appearing cyst in the lower pole of the right kidney. No hydronephrosis.  Prior appendectomy. Uterus, adnexae and urinary bladder are grossly unremarkable. Scattered sigmoid diverticula. No active diverticulitis. The sigmoid wall appears thickened, but is likely related to decompressed state. No surrounding inflammatory change. Stomach and small bowel are unremarkable.  Aorta and iliac vessels are unremarkable. No free fluid, free air or adenopathy.  No acute bony  abnormality or focal bone lesion.  FINDINGS: Sigmoid diverticulosis.  No active diverticulitis.  No acute findings in the abdomen or pelvis.   Electronically Signed   By: Rolm Baptise M.D.   On: 04/24/2014 16:34     EKG Interpretation None      MDM   Final diagnoses:  Near syncope  GI bleed  Hematochezia   Patient presenting after 2 episodes of rectal bleeding with a prodrome of lightheadedness, diaphoresis and sensation that she was going to pass out. On arrival she is nontoxic appearing and in no apparent distress. Afebrile, vital signs stable. Abdomen soft and nontender. Rectal exam with external hemorrhoids, no bleeding, no gross blood. Fecal occult blood negative. Labs showing mild leukocytosis of 11.1, however she recently completed an eight-day course of prednisone. Otherwise no acute findings. Hemodynamically stable. She had 2 more episodes of rectal bleeding in the emergency department. Plan to obtain an abdominal CT for further evaluation. 4:47 PM CT showing sigmoid diverticulosis. No diverticulitis. No other acute findings. CXR clear. EKG unchanged. Bleeding most likely from diverticulosis. Advised her to continue her abx and f/u with her PCP. Remains hemodynamically stable. Stable for d/c.  Case discussed with attending Dr. Colin Rhein who also evaluated patient and agrees with plan of care.    Carman Ching, PA-C 04/24/14 1649

## 2014-04-24 NOTE — ED Notes (Signed)
C/o diarrhea this am with rectal bleeding with diarrhea last BM approx 1130am -recent tx of rash with prednisone and doxycycline

## 2014-04-24 NOTE — Discharge Instructions (Signed)
Bloody Diarrhea Bloody diarrhea can be caused by many different conditions. Most of the time bloody diarrhea is the result of food poisoning or minor infections. Bloody diarrhea usually improves over 2 to 3 days of rest and fluid replacement. Other conditions that can cause bloody diarrhea include:  Internal bleeding.  Infection.  Diseases of the bowel and colon. Internal bleeding from an ulcer or bowel disease can be severe and requires hospital care or even surgery. DIAGNOSIS  To find out what is wrong your caregiver may check your:  Stool.  Blood.  Results from a test that looks inside the body (endoscopy). TREATMENT   Get plenty of rest.  Drink enough water and fluids to keep your urine clear or pale yellow.  Do not smoke.  Solid foods and dairy products should be avoided until your illness improves.  As you improve, slowly return to a regular diet with easily-digested foods first. Examples are:  Bananas.  Rice.  Toast.  Crackers. You should only need these for about 2 days before adding more normal foods to your diet.  Avoid spicy or fatty foods as well as caffeine and alcohol for several days.  Medicine to control cramping and diarrhea can relieve symptoms but may prolong some cases of bloody diarrhea. Antibiotics can speed recovery from diarrhea due to some bacterial infections. Call your caregiver if diarrhea does not get better in 3 days. SEEK MEDICAL CARE IF:   You do not improve after 3 days.  Your diarrhea improves but your stool appears black. SEEK IMMEDIATE MEDICAL CARE IF:   You become extremely weak or faint.  You become very sweaty.  You have increased pain or bleeding.  You develop repeated vomiting.  You vomit and you see blood or the vomit looks black in color.  You have a fever. Document Released: 06/14/2005 Document Revised: 09/06/2011 Document Reviewed: 05/16/2009 [facility] Patient Information 2015 [place], [place]. This information is  not intended to replace advice given to you by your health care provider. Make sure you discuss any questions you have with your health care provider. Diverticulosis Diverticulosis is the condition that develops when small pouches (diverticula) form in the wall of your colon. Your colon, or large intestine, is where water is absorbed and stool is formed. The pouches form when the inside layer of your colon pushes through weak spots in the outer layers of your colon. CAUSES  No one knows exactly what causes diverticulosis. RISK FACTORS  Being older than 9. Your risk for this condition increases with age. Diverticulosis is rare in people younger than 40 years. By age 26, almost everyone has it.  Eating a low-fiber diet.  Being frequently constipated.  Being overweight.  Not getting enough exercise.  Smoking.  Taking over-the-counter pain medicines, like aspirin and ibuprofen. SYMPTOMS  Most people with diverticulosis do not have symptoms. DIAGNOSIS  Because diverticulosis often has no symptoms, health care providers often discover the condition during an exam for other colon problems. In many cases, a health care provider will diagnose diverticulosis while using a flexible scope to examine the colon (colonoscopy). TREATMENT  If you have never developed an infection related to diverticulosis, you may not need treatment. If you have had an infection before, treatment may include:  Eating more fruits, vegetables, and grains.  Taking a fiber supplement.  Taking a live bacteria supplement (probiotic).  Taking medicine to relax your colon. HOME CARE INSTRUCTIONS   Drink at least 6-8 glasses of water each day to prevent constipation.  Try not to strain when you have a bowel movement.  Keep all follow-up appointments. If you have had an infection before:  Increase the fiber in your diet as directed by your health care provider or dietitian.  Take a dietary fiber supplement if  your health care provider approves.  Only take medicines as directed by your health care provider. SEEK MEDICAL CARE IF:   You have abdominal pain.  You have bloating.  You have cramps.  You have not gone to the bathroom in 3 days. SEEK IMMEDIATE MEDICAL CARE IF:   Your pain gets worse.  Yourbloating becomes very bad.  You have a fever or chills, and your symptoms suddenly get worse.  You begin vomiting.  You have bowel movements that are bloody or black. MAKE SURE YOU:  Understand these instructions.  Will watch your condition.  Will get help right away if you are not doing well or get worse. Document Released: 03/11/2004 Document Revised: 06/19/2013 Document Reviewed: 05/09/2013 [facility] Patient Information 2015 [place], [place]. This information is not intended to replace advice given to you by your health care provider. Make sure you discuss any questions you have with your health care provider.  Near-Syncope Near-syncope (commonly known as near fainting) is sudden weakness, dizziness, or feeling like you might pass out. During an episode of near-syncope, you may also develop pale skin, have tunnel vision, or feel sick to your stomach (nauseous). Near-syncope may occur when getting up after sitting or while standing for a long time. It is caused by a sudden decrease in blood flow to the brain. This decrease can result from various causes or triggers, most of which are not serious. However, because near-syncope can sometimes be a sign of something serious, a medical evaluation is required. The specific cause is often not determined. HOME CARE INSTRUCTIONS  Monitor your condition for any changes. The following actions may help to alleviate any discomfort you are experiencing:  Have someone stay with you until you feel stable.  Lie down right away and prop your feet up if you start feeling like you might faint. Breathe deeply and steadily. Wait until all the symptoms have  passed. Most of these episodes last only a few minutes. You may feel tired for several hours.   Drink enough fluids to keep your urine clear or pale yellow.   If you are taking blood pressure or heart medicine, get up slowly when seated or lying down. Take several minutes to sit and then stand. This can reduce dizziness.  Follow up with your health care provider as directed. SEEK IMMEDIATE MEDICAL CARE IF:   You have a severe headache.   You have unusual pain in the chest, abdomen, or back.   You are bleeding from the mouth or rectum, or you have black or tarry stool.   You have an irregular or very fast heartbeat.   You have repeated fainting or have seizure-like jerking during an episode.   You faint when sitting or lying down.   You have confusion.   You have difficulty walking.   You have severe weakness.   You have vision problems.  MAKE SURE YOU:   Understand these instructions.  Will watch your condition.  Will get help right away if you are not doing well or get worse. Document Released: 06/14/2005 Document Revised: 06/19/2013 Document Reviewed: 11/17/2012 [facility] Patient Information 2015 [place], [place]. This information is not intended to replace advice given to you by your health care provider. Make  sure you discuss any questions you have with your health care provider.  Rectal Bleeding Rectal bleeding is when blood passes out of the anus. It is usually a sign that something is wrong. It may not be serious, but it should always be evaluated. Rectal bleeding may present as bright red blood or extremely dark stools. The color may range from dark red or maroon to black (like tar). It is important that the cause of rectal bleeding be identified so treatment can be started and the problem corrected. CAUSES   Hemorrhoids. These are enlarged (dilated) blood vessels or veins in the anal or rectal area.  Fistulas. Theseare abnormal, burrowing channels that  usually run from inside the rectum to the skin around the anus. They can bleed.  Anal fissures. This is a tear in the tissue of the anus. Bleeding occurs with bowel movements.  Diverticulosis. This is a condition in which pockets or sacs project from the bowel wall. Occasionally, the sacs can bleed.  Diverticulitis. Thisis an infection involving diverticulosis of the colon.  Proctitis and colitis. These are conditions in which the rectum, colon, or both, can become inflamed and pitted (ulcerated).  Polyps and cancer. Polyps are non-cancerous (benign) growths in the colon that may bleed. Certain types of polyps turn into cancer.  Protrusion of the rectum. Part of the rectum can project from the anus and bleed.  Certain medicines.  Intestinal infections.  Blood vessel abnormalities. HOME CARE INSTRUCTIONS  Eat a high-fiber diet to keep your stool soft.  Limit activity.  Drink enough fluids to keep your urine clear or pale yellow.  Warm baths may be useful to soothe rectal pain.  Follow up with your caregiver as directed. SEEK IMMEDIATE MEDICAL CARE IF:  You develop increased bleeding.  You have black or dark red stools.  You vomit blood or material that looks like coffee grounds.  You have abdominal pain or tenderness.  You have a fever.  You feel weak, nauseous, or you faint.  You have severe rectal pain or you are unable to have a bowel movement. MAKE SURE YOU:  Understand these instructions.  Will watch your condition.  Will get help right away if you are not doing well or get worse. Document Released: 12/04/2001 Document Revised: 09/06/2011 Document Reviewed: 11/29/2010 [facility] Patient Information 2015 [place], [place]. This information is not intended to replace advice given to you by your health care provider. Make sure you discuss any questions you have with your health care provider.

## 2014-04-25 DIAGNOSIS — K649 Unspecified hemorrhoids: Secondary | ICD-10-CM | POA: Diagnosis not present

## 2014-04-25 DIAGNOSIS — K625 Hemorrhage of anus and rectum: Secondary | ICD-10-CM | POA: Diagnosis not present

## 2014-04-25 DIAGNOSIS — R21 Rash and other nonspecific skin eruption: Secondary | ICD-10-CM | POA: Diagnosis not present

## 2014-04-25 DIAGNOSIS — R109 Unspecified abdominal pain: Secondary | ICD-10-CM | POA: Diagnosis not present

## 2014-04-26 NOTE — ED Provider Notes (Signed)
Medical screening examination/treatment/procedure(s) were conducted as a shared visit with non-physician practitioner(s) and myself.  I personally evaluated the patient during the encounter.   EKG Interpretation None       Briefly, pt is a 78 y.o. female presenting with nausea/diarrhea, hematochezia.  She had antecedent malaise and diaphoresis prior to diarrhea, and had 2 normal BM prior to passing blood clots.  She denied chest pain, dyspnea, or other concerning near syncopal features at that time. I performed an examination on the patient including cardiac, pulmonary, and gi systems which were unremarkable, including no abd tenderness.  Labs obtained and with slight leukocytosis.  CT scan with diverticulosis, which is likely etiology of symptoms.  Pt also had a targetoid lesion on her abd for which she was seeing dermatology and was currently on a course of doxcycline, and was concerned that this could be causing her symptoms.  I agreed that it could be causing diarrhea, but given appearance of rash encouraged her to continue course.  DC home in stable condition.  Debby Freiberg, MD 04/26/14 [phone]

## 2014-05-02 ENCOUNTER — Other Ambulatory Visit: Payer: Self-pay | Admitting: Internal Medicine

## 2014-05-02 DIAGNOSIS — R21 Rash and other nonspecific skin eruption: Secondary | ICD-10-CM | POA: Diagnosis not present

## 2014-05-02 DIAGNOSIS — R109 Unspecified abdominal pain: Secondary | ICD-10-CM | POA: Diagnosis not present

## 2014-05-02 DIAGNOSIS — I739 Peripheral vascular disease, unspecified: Principal | ICD-10-CM

## 2014-05-02 DIAGNOSIS — I779 Disorder of arteries and arterioles, unspecified: Secondary | ICD-10-CM

## 2014-05-02 DIAGNOSIS — K625 Hemorrhage of anus and rectum: Secondary | ICD-10-CM | POA: Diagnosis not present

## 2014-05-02 DIAGNOSIS — Z23 Encounter for immunization: Secondary | ICD-10-CM | POA: Diagnosis not present

## 2014-05-02 DIAGNOSIS — K649 Unspecified hemorrhoids: Secondary | ICD-10-CM | POA: Diagnosis not present

## 2014-05-06 ENCOUNTER — Ambulatory Visit
Admission: RE | Admit: 2014-05-06 | Discharge: 2014-05-06 | Disposition: A | Discharge status: Home / Self Care | Payer: Medicare Other | Source: Ambulatory Visit | Attending: Internal Medicine | Admitting: Internal Medicine

## 2014-05-06 DIAGNOSIS — Z720 Tobacco use: Secondary | ICD-10-CM | POA: Diagnosis not present

## 2014-05-06 DIAGNOSIS — I739 Peripheral vascular disease, unspecified: Principal | ICD-10-CM

## 2014-05-06 DIAGNOSIS — I779 Disorder of arteries and arterioles, unspecified: Secondary | ICD-10-CM

## 2014-05-06 DIAGNOSIS — I6521 Occlusion and stenosis of right carotid artery: Secondary | ICD-10-CM | POA: Diagnosis not present

## 2014-05-14 ENCOUNTER — Ambulatory Visit (INDEPENDENT_AMBULATORY_CARE_PROVIDER_SITE_OTHER): Payer: Medicare Other | Admitting: Cardiology

## 2014-05-14 ENCOUNTER — Encounter: Payer: Self-pay | Admitting: Cardiology

## 2014-05-14 VITALS — BP 130/84 | HR 53 | Ht 64.0 in | Wt 149.0 lb

## 2014-05-14 DIAGNOSIS — I2583 Coronary atherosclerosis due to lipid rich plaque: Principal | ICD-10-CM

## 2014-05-14 DIAGNOSIS — I739 Peripheral vascular disease, unspecified: Secondary | ICD-10-CM | POA: Diagnosis not present

## 2014-05-14 DIAGNOSIS — Z79899 Other long term (current) drug therapy: Secondary | ICD-10-CM

## 2014-05-14 DIAGNOSIS — E7800 Pure hypercholesterolemia, unspecified: Secondary | ICD-10-CM

## 2014-05-14 DIAGNOSIS — I2510 Atherosclerotic heart disease of native coronary artery without angina pectoris: Secondary | ICD-10-CM | POA: Diagnosis not present

## 2014-05-14 DIAGNOSIS — I471 Supraventricular tachycardia, unspecified: Secondary | ICD-10-CM

## 2014-05-14 DIAGNOSIS — I4719 Other supraventricular tachycardia: Secondary | ICD-10-CM

## 2014-05-14 MED ORDER — ROSUVASTATIN CALCIUM 5 MG PO TABS: 5.0000 mg | ORAL_TABLET | ORAL | Status: DC

## 2014-05-14 NOTE — Progress Notes (Signed)
[name]. [address], [address] [place], [place]  [address] Phone: [phone] Fax:  [phone]  Date:  05/14/2014   ID:  Taylor REIERSON, DOB 11/14/1935, MRN [id number]  PCP:  Henrine Screws, MD   History of Present Illness: [name] is a 78 y.o. female with paroxysmal atrial tachycardia, no evidence of significant coronary artery disease on catheterization in 2012, here for followup. She stopped diltiazem in the past because of constipation. Atenolol has been used. This has helped with her palpitations. Her cholesterol is currently reasonably/very well controlled with very low dose Crestor. Previous stabbing pain in legs. Her last LDL is 83 (7/13). She does have mild carotid artery disease on last check, 20%. Was walking and felt a funny feeling in chest.   Has had only mild palpitations. Lost her husband in 2014. She's also noticed some hair loss with atenolol. We have tried different medications but she has to sided to go back to the atenolol.  She had a skin reaction, several different doctor visits, 5 different doctors, unable to get to find diagnosis he states. Improving. This was over her abdomen.   Wt Readings from Last 3 Encounters:  05/14/14 149 lb (67.586 kg)  04/24/14 152 lb (68.947 kg)  12/25/13 152 lb (68.947 kg)     Past Medical History  Diagnosis Date  . Hypertension   . Hypercholesteremia   . Colon polyp   . Occlusion and stenosis of carotid artery without mention of cerebral infarction   . CAD (coronary artery disease)   . Chest pain   . GERD (gastroesophageal reflux disease)   . IBS (irritable bowel syndrome)   . Paresthesia   . Neck pain   . Palpitation   . RBBB   . Situational depression   . DJD (degenerative joint disease)   . PVD (peripheral vascular disease)     Past Surgical History  Procedure Laterality Date  . Breast biopsy    . Tonsillectomy    . Tubal ligation    . Cholecystectomy    . Appendectomy      Current  Outpatient Prescriptions  Medication Sig Dispense Refill  . acyclovir ointment (ZOVIRAX) 5 % Apply 1 application topically every 3 (three) hours.    Marland Kitchen aspirin 81 MG tablet Take 81 mg by mouth daily.    Marland Kitchen aspirin-acetaminophen-caffeine (EXCEDRIN MIGRAINE) 250-250-65 MG per tablet Take by mouth every 6 (six) hours as needed for headache.    Marland Kitchen atenolol (TENORMIN) 25 MG tablet Take by mouth daily.    . Cholecalciferol (VITAMIN D) 2000 UNITS CAPS Take 2,000 Units by mouth daily.    [name] Sodium (COLACE PO) Take by mouth.    . lansoprazole (PREVACID) 15 MG capsule Take 15 mg by mouth every other day.    . lisinopril-hydrochlorothiazide (PRINZIDE,ZESTORETIC) 20-12.5 MG per tablet TAKE 1/2 TABLET TWICE A DAY 90 tablet 2  . loratadine (CLARITIN) 10 MG tablet Take 10 mg by mouth daily.    . methocarbamol (ROBAXIN) 500 MG tablet Take 500 mg by mouth every 6 (six) hours as needed for muscle spasms.     . nitroGLYCERIN (NITROSTAT) 0.4 MG SL tablet Place 0.4 mg under the tongue every 5 (five) minutes as needed for chest pain.    . Omega-3 Fatty Acids (FISH OIL PO) Take by mouth.    . Psyllium (METAMUCIL PO) Take by mouth.    . rosuvastatin (CRESTOR) 5 MG tablet Take 1/4 pill daily 30 tablet  3   No current facility-administered medications for this visit.    Allergies:    Allergies  Allergen Reactions  . Ceftin [Cefuroxime Axetil]     Double vision and joint pain  . Ciprofloxacin Hcl     Pain in stomach, bleeding.    Social History:  The patient  reports that she has quit smoking. She does not have any smokeless tobacco history on file. She reports that she does not drink alcohol or use illicit drugs.   ROS:  Please see the history of present illness. Recent skin rash, extensive workup unremarkable.  No CP, no bleeding, no chest pain, no orthopnea    PHYSICAL EXAM: VS:  BP 130/84 mmHg  Pulse 53  Ht 5\' 4"  (1.626 m)  Wt 149 lb (67.586 kg)  BMI 25.56 kg/m2 Well nourished, well developed, in  no acute distressmildly anxious HEENT: normal Neck: no JVD Cardiac:  normal S1, S2; RRR; no murmur Lungs:  clear to auscultation bilaterally, no wheezing, rhonchi or rales Abd: soft, nontender, no hepatomegaly Ext: no edema Skin: warm and dry Neuro: no focal abnormalities noted  EKG:  07/27/11-sinus rhythm rate 64, right bundle branch block, chronic   Cardiac catheterization-03/16/11-no CAD. Tortuous vessels. EF 70%.  Labs: 07/24/13-ALT 18, LDL 84.  ASSESSMENT AND PLAN:  1. Paroxysmal atrial tachycardia-she decided to go back to atenolol. 2. Right bundle branch block/sinus bradycardia-monitor. No syncope. I would not like to increase atenolol. Low-dose atenolol seems to be controlling her paroxysmal atrial tachycardia fairly well. 3. Mild right carotid stenosis, 50-69%. Scan on 05/06/14. Continue with statin. 4. Hair loss-monitor with atenolol 5. Hypertension-fairly well controlled.  6. Hyperlipidemia-extremely low-dose Crestor.She cuts into one fourth tablet. LDL 84 on 07/24/13. Liver functions normal. We will repeat in one year. 7. Skin reaction-she has a prescription for acyclovir but she was worried about taking it because of package insert stating bradycardia. She will monitor her heart rate with her blood pressure cuff. If in the low 40s, let us know.   Signed, Candee Furbish, MD [facility]  05/14/2014 9:03 AM

## 2014-05-14 NOTE — Patient Instructions (Signed)
The current medical regimen is effective;  continue present plan and medications.  Please have blood work in 1 year (Lipid/ALT)  Follow up in 1 year with Dr. Marlou Porch.  You will receive a letter in the mail 2 months before you are due.  Please call us when you receive this letter to schedule your follow up appointment.

## 2014-05-17 ENCOUNTER — Other Ambulatory Visit: Payer: Self-pay

## 2014-05-17 MED ORDER — ROSUVASTATIN CALCIUM 5 MG PO TABS: 5.0000 mg | ORAL_TABLET | ORAL | Status: DC

## 2014-05-21 ENCOUNTER — Encounter: Payer: Self-pay | Admitting: Cardiology

## 2014-05-21 ENCOUNTER — Telehealth: Payer: Self-pay | Admitting: Cardiology

## 2014-05-21 NOTE — Telephone Encounter (Signed)
Calling stating she is filling out some insurance papers and needs to put her current diagnosis.  Reviewed her chart and gave her the diagnosis listed in last OV by Dr. Marlou Porch. She states that that helps but will probably put her in another bracket. Appreciates the call.

## 2014-05-21 NOTE — Telephone Encounter (Signed)
New message      Pt needs to know what her diagnosis is?   She is completing ins papers

## 2014-05-27 DIAGNOSIS — J069 Acute upper respiratory infection, unspecified: Secondary | ICD-10-CM | POA: Diagnosis not present

## 2014-06-04 ENCOUNTER — Other Ambulatory Visit: Payer: Self-pay

## 2014-06-04 MED ORDER — ATENOLOL 25 MG PO TABS: 25.0000 mg | ORAL_TABLET | Freq: Every day | ORAL | Status: DC

## 2014-07-01 DIAGNOSIS — L049 Acute lymphadenitis, unspecified: Secondary | ICD-10-CM | POA: Diagnosis not present

## 2014-07-26 DIAGNOSIS — M549 Dorsalgia, unspecified: Secondary | ICD-10-CM | POA: Diagnosis not present

## 2014-07-26 DIAGNOSIS — R21 Rash and other nonspecific skin eruption: Secondary | ICD-10-CM | POA: Diagnosis not present

## 2014-08-05 DIAGNOSIS — R312 Other microscopic hematuria: Secondary | ICD-10-CM | POA: Diagnosis not present

## 2014-08-05 DIAGNOSIS — N3941 Urge incontinence: Secondary | ICD-10-CM | POA: Diagnosis not present

## 2014-08-05 DIAGNOSIS — R109 Unspecified abdominal pain: Secondary | ICD-10-CM | POA: Diagnosis not present

## 2014-08-15 ENCOUNTER — Encounter: Payer: Self-pay | Admitting: Cardiology

## 2014-08-15 ENCOUNTER — Ambulatory Visit (INDEPENDENT_AMBULATORY_CARE_PROVIDER_SITE_OTHER): Payer: Medicare Other | Admitting: Cardiology

## 2014-08-15 VITALS — BP 128/76 | HR 60 | Ht 64.0 in | Wt 150.0 lb

## 2014-08-15 DIAGNOSIS — I4510 Unspecified right bundle-branch block: Secondary | ICD-10-CM

## 2014-08-15 DIAGNOSIS — L659 Nonscarring hair loss, unspecified: Secondary | ICD-10-CM | POA: Diagnosis not present

## 2014-08-15 DIAGNOSIS — I471 Supraventricular tachycardia: Secondary | ICD-10-CM | POA: Diagnosis not present

## 2014-08-15 NOTE — Patient Instructions (Signed)
The current medical regimen is effective;  continue present plan and medications.  Please call back with the information about the Atenolol so we can send in a new prescription.  Follow up in 6 months with Dr. Marlou Porch.  You will receive a letter in the mail 2 months before you are due.  Please call us when you receive this letter to schedule your follow up appointment.  Thank you for choosing [facility]!!

## 2014-08-15 NOTE — Progress Notes (Signed)
[facility]. [address], [address] [place], [place]  [address] Phone: [phone] Fax:  [phone]  Date:  08/15/2014   ID:  [name], DOB [birth year], MRN [id number]  PCP:  Henrine Screws, MD   History of Present Illness: [name] is a 79 y.o. female with paroxysmal atrial tachycardia, no evidence of significant coronary artery disease on catheterization in 2012, here for followup. She stopped diltiazem in the past because of constipation. Atenolol has been used. This has helped with her palpitations. Her cholesterol is currently reasonably/very well controlled with very low dose Crestor. Previous stabbing pain in legs. Her last LDL is 83 (7/13). She does have mild carotid artery disease on last check, 20%. Was walking and felt a funny feeling in chest.   Has had only mild palpitations. Lost her husband in 2014. She's also noticed some hair loss with atenolol. We have tried different medications but she has to sided to go back to the atenolol.  She had a skin reaction, several different doctor visits, 5 different doctors, unable to get to find diagnosis he states. Improving. This was over her abdomen.  08/15/14 - been having sharp cutting pain in left chest few seconds, left flank and LUQ abd all in different areas and times. Urology, cysts, blood in urine. No fevers, no chills. Was in ER, after bite in mid abdomen, 4 spots, red spots, then grew and twice as many. Prednisone given. Gates placed on abx after, then ER. Blood in stool??   Wt Readings from Last 3 Encounters:  08/15/14 150 lb (68.04 kg)  05/14/14 149 lb (67.586 kg)  04/24/14 152 lb (68.947 kg)     Past Medical History  Diagnosis Date  . Hypertension   . Hypercholesteremia   . Colon polyp   . Occlusion and stenosis of carotid artery without mention of cerebral infarction   . CAD (coronary artery disease)   . Chest pain   . GERD (gastroesophageal reflux disease)   . IBS (irritable bowel syndrome)   .  Paresthesia   . Neck pain   . Palpitation   . RBBB   . Situational depression   . DJD (degenerative joint disease)   . PVD (peripheral vascular disease)     Past Surgical History  Procedure Laterality Date  . Breast biopsy    . Tonsillectomy    . Tubal ligation    . Cholecystectomy    . Appendectomy      Current Outpatient Prescriptions  Medication Sig Dispense Refill  . acyclovir ointment (ZOVIRAX) 5 % Apply 1 application topically every 3 (three) hours.    Marland Kitchen aspirin 81 MG tablet Take 81 mg by mouth daily.    Marland Kitchen aspirin-acetaminophen-caffeine (EXCEDRIN MIGRAINE) 250-250-65 MG per tablet Take by mouth every 6 (six) hours as needed for headache.    Marland Kitchen atenolol (TENORMIN) 25 MG tablet Take 1 tablet (25 mg total) by mouth daily. 90 tablet 1  . Cholecalciferol (VITAMIN D) 2000 UNITS CAPS Take 2,000 Units by mouth daily.    [name] Sodium (COLACE PO) Take by mouth.    . lansoprazole (PREVACID) 15 MG capsule Take 15 mg by mouth every other day.    . lisinopril-hydrochlorothiazide (PRINZIDE,ZESTORETIC) 20-12.5 MG per tablet TAKE 1/2 TABLET TWICE A DAY 90 tablet 2  . loratadine (CLARITIN) 10 MG tablet Take 10 mg by mouth daily.    . methocarbamol (ROBAXIN) 500 MG tablet Take 500 mg by mouth every 6 (six) hours  as needed for muscle spasms.     . nitroGLYCERIN (NITROSTAT) 0.4 MG SL tablet Place 0.4 mg under the tongue every 5 (five) minutes as needed for chest pain.    . Omega-3 Fatty Acids (FISH OIL PO) Take by mouth.    . Psyllium (METAMUCIL PO) Take by mouth.    . rosuvastatin (CRESTOR) 5 MG tablet Take 1 tablet (5 mg total) by mouth as directed. (Patient taking differently: Take 5 mg by mouth as directed. Taking 1/4 Tablet daily) 90 tablet 3   No current facility-administered medications for this visit.    Allergies:    Allergies  Allergen Reactions  . Ceftin [Cefuroxime Axetil]     Double vision and joint pain  . Ciprofloxacin Hcl     Pain in stomach, bleeding.    Social  History:  The patient  reports that she has quit smoking. She does not have any smokeless tobacco history on file. She reports that she does not drink alcohol or use illicit drugs.   ROS:  Please see the history of present illness. Recent skin rash, extensive workup unremarkable.  No CP, no bleeding, no chest pain, no orthopnea    PHYSICAL EXAM: VS:  BP 128/76 mmHg  Pulse 60  Ht 5\' 4"  (1.626 m)  Wt 150 lb (68.04 kg)  BMI 25.73 kg/m2 Well nourished, well developed, in no acute distressmildly anxious HEENT: normal Neck: no JVD Cardiac:  normal S1, S2; RRR; no murmur Lungs:  clear to auscultation bilaterally, no wheezing, rhonchi or rales Abd: soft, nontender, no hepatomegaly Ext: no edema Skin: warm and dry Neuro: no focal abnormalities noted  EKG:  07/27/11-sinus rhythm rate 64, right bundle branch block, chronic   Cardiac catheterization-03/16/11-no CAD. Tortuous vessels. EF 70%.  Labs: 07/24/13-ALT 18, LDL 84.  ASSESSMENT AND PLAN:  1. Paroxysmal atrial tachycardia-she decided to go back to atenolol. 2. Atypical chest pain-sharp, fleeting pain. Reassurance has been given. Likely musculoskeletal cramping. Once again reassured her with prior cardiac catheterization. 3. Right bundle branch block/sinus bradycardia-monitor. No syncope. I would not like to increase atenolol. Low-dose atenolol seems to be controlling her paroxysmal atrial tachycardia fairly well. See below, we are going to try diltiazem 4. Mild right carotid stenosis, 50-69%. Scan on 05/06/14. Continue with statin. 5. Hair loss-atenolol? We have tried switching to other medications in the past. She states that she would like to try this once again. I think that she had been on diltiazem but she did not complete a full month's worth. She would like to give it a longer trial. She will call us up and we will refill that medication. We will stop atenolol once we understand what medication this is. I do want her to be on either beta  blocker or calcium channel blocker given her paroxysmal atrial tachycardia. 6. Hypertension- well controlled.  7. Hyperlipidemia-extremely low-dose Crestor.She cuts into one fourth tablet. LDL 84 on 07/24/13. Liver functions normal.     Signed, Candee Furbish, MD [facility]  08/15/2014 8:40 AM

## 2014-08-22 ENCOUNTER — Other Ambulatory Visit: Payer: Self-pay

## 2014-08-22 DIAGNOSIS — Z1231 Encounter for screening mammogram for malignant neoplasm of breast: Secondary | ICD-10-CM

## 2014-08-27 DIAGNOSIS — R312 Other microscopic hematuria: Secondary | ICD-10-CM | POA: Diagnosis not present

## 2014-08-27 DIAGNOSIS — N281 Cyst of kidney, acquired: Secondary | ICD-10-CM | POA: Diagnosis not present

## 2014-09-10 ENCOUNTER — Ambulatory Visit
Admission: RE | Admit: 2014-09-10 | Discharge: 2014-09-10 | Disposition: A | Discharge status: Home / Self Care | Payer: Medicare Other | Source: Ambulatory Visit

## 2014-09-10 DIAGNOSIS — Z1231 Encounter for screening mammogram for malignant neoplasm of breast: Secondary | ICD-10-CM

## 2014-09-16 DIAGNOSIS — N390 Urinary tract infection, site not specified: Secondary | ICD-10-CM | POA: Diagnosis not present

## 2014-09-16 DIAGNOSIS — R319 Hematuria, unspecified: Secondary | ICD-10-CM | POA: Diagnosis not present

## 2014-10-07 ENCOUNTER — Other Ambulatory Visit: Payer: Self-pay | Admitting: Cardiology

## 2014-10-07 ENCOUNTER — Telehealth: Payer: Self-pay | Admitting: Cardiology

## 2014-10-07 NOTE — Telephone Encounter (Signed)
Per pt call - reports she has been loosing too much hair and wants to change back to Diltiazem.  Advised I will ask Dr Marlou Porch to obtain an order. Once that has occurred I will send in the RX to [facility] as requested and notify her when that has occurred.  She states understanding.  She has been on Diltiazem 120 mg in the past according to documentation.

## 2014-10-07 NOTE — Telephone Encounter (Signed)
New message       Pt want to switch to diltiazem.  Please call it in to [facility]

## 2014-10-08 MED ORDER — DILTIAZEM HCL ER COATED BEADS 120 MG PO CP24: 120.0000 mg | ORAL_CAPSULE | Freq: Every day | ORAL | Status: DC

## 2014-10-08 NOTE — Telephone Encounter (Signed)
Pt aware OK to stop Atenolol and start Diltiazem 120 mg daily.  rx sent into pharmacy as requested.

## 2014-10-08 NOTE — Telephone Encounter (Signed)
OK with me.  Candee Furbish, MD

## 2014-10-16 DIAGNOSIS — L821 Other seborrheic keratosis: Secondary | ICD-10-CM | POA: Diagnosis not present

## 2014-10-16 DIAGNOSIS — Z85828 Personal history of other malignant neoplasm of skin: Secondary | ICD-10-CM | POA: Diagnosis not present

## 2014-10-16 DIAGNOSIS — L814 Other melanin hyperpigmentation: Secondary | ICD-10-CM | POA: Diagnosis not present

## 2014-11-28 ENCOUNTER — Telehealth: Payer: Self-pay | Admitting: Cardiology

## 2014-11-28 NOTE — Telephone Encounter (Signed)
New Message  Pt made appt w/ Dr. Marlou Porch for August. Pt wanted to make sure that she does not need any labs on that day. No orders in the system. Please call back and discuss.

## 2014-11-28 NOTE — Telephone Encounter (Signed)
Spoke with pt who reports that Dr Inda Merlin has not done any recent blood work on her.  Advised pt that according to our records she is due for lipid and ALT in 04/2015 but she could have it early if she would like.  Also advised her that she can wait until her appt and can have any blood work ordered, drawn the same day.  She is in agreement and thanked me for my time.

## 2014-12-13 ENCOUNTER — Telehealth: Payer: Self-pay | Admitting: Cardiology

## 2014-12-13 DIAGNOSIS — R002 Palpitations: Secondary | ICD-10-CM

## 2014-12-13 NOTE — Telephone Encounter (Signed)
Spoke with pt who reports she is having an increase in the frequency of palpitations.  She states she used to be able to cough and clear the palps up but now that doesn't always work.  She is not having any pain but her palps her getting worse.  She denies any increase in the use of caffeine or medications containing stimulates.  Her medications have been changed several times now from atenolol to diltiazem.  She most recently restarted diltiazem in April.  Pt is not having any palps at the present time however she is very anxious to "know what is going on."  Advised I will forward this information for review by Dr Marlou Porch.  She is aware he may want to order a monitor.  The palps do not always occur everyday but have been more frequent.  She does have an appt but it is not until August.

## 2014-12-13 NOTE — Telephone Encounter (Signed)
New Message     Pt is calling and pt states that 02-13-15 pt is not soon enough for her  She said that if nothing is sooner she will find a new doctor

## 2014-12-14 NOTE — Telephone Encounter (Signed)
Please order an event monitor Candee Furbish, MD

## 2014-12-18 NOTE — Telephone Encounter (Signed)
Attempted to call pt.  Phone rang several times with no answer or voicemail.  Unable to leave message.  Will try again later.

## 2014-12-23 NOTE — Telephone Encounter (Signed)
Pt returned call. Informed pt of new order for event monitor. Informed pt that I would have scheduler call and get her an appt to have monitor placed. Pt verbalized understanding and was in agreement with this plan.

## 2014-12-24 ENCOUNTER — Ambulatory Visit (INDEPENDENT_AMBULATORY_CARE_PROVIDER_SITE_OTHER): Payer: Medicare Other

## 2014-12-24 DIAGNOSIS — R002 Palpitations: Secondary | ICD-10-CM

## 2015-01-22 DIAGNOSIS — M25561 Pain in right knee: Secondary | ICD-10-CM | POA: Diagnosis not present

## 2015-01-22 DIAGNOSIS — M25562 Pain in left knee: Secondary | ICD-10-CM | POA: Diagnosis not present

## 2015-02-12 DIAGNOSIS — Z1389 Encounter for screening for other disorder: Secondary | ICD-10-CM | POA: Diagnosis not present

## 2015-02-12 DIAGNOSIS — I6529 Occlusion and stenosis of unspecified carotid artery: Secondary | ICD-10-CM | POA: Diagnosis not present

## 2015-02-12 DIAGNOSIS — E782 Mixed hyperlipidemia: Secondary | ICD-10-CM | POA: Diagnosis not present

## 2015-02-12 DIAGNOSIS — I10 Essential (primary) hypertension: Secondary | ICD-10-CM | POA: Diagnosis not present

## 2015-02-12 DIAGNOSIS — G4700 Insomnia, unspecified: Secondary | ICD-10-CM | POA: Diagnosis not present

## 2015-02-12 DIAGNOSIS — F329 Major depressive disorder, single episode, unspecified: Secondary | ICD-10-CM | POA: Diagnosis not present

## 2015-02-12 DIAGNOSIS — Z0001 Encounter for general adult medical examination with abnormal findings: Secondary | ICD-10-CM | POA: Diagnosis not present

## 2015-02-12 DIAGNOSIS — K589 Irritable bowel syndrome without diarrhea: Secondary | ICD-10-CM | POA: Diagnosis not present

## 2015-02-12 DIAGNOSIS — G44209 Tension-type headache, unspecified, not intractable: Secondary | ICD-10-CM | POA: Diagnosis not present

## 2015-02-12 DIAGNOSIS — Z79899 Other long term (current) drug therapy: Secondary | ICD-10-CM | POA: Diagnosis not present

## 2015-02-12 DIAGNOSIS — M549 Dorsalgia, unspecified: Secondary | ICD-10-CM | POA: Diagnosis not present

## 2015-02-13 ENCOUNTER — Ambulatory Visit: Payer: Medicare Other | Admitting: Cardiology

## 2015-02-25 ENCOUNTER — Ambulatory Visit (INDEPENDENT_AMBULATORY_CARE_PROVIDER_SITE_OTHER): Payer: Medicare Other | Admitting: Cardiology

## 2015-02-25 ENCOUNTER — Encounter: Payer: Self-pay | Admitting: Cardiology

## 2015-02-25 VITALS — BP 132/70 | HR 65 | Ht 64.0 in | Wt 153.5 lb

## 2015-02-25 DIAGNOSIS — I2510 Atherosclerotic heart disease of native coronary artery without angina pectoris: Secondary | ICD-10-CM | POA: Diagnosis not present

## 2015-02-25 DIAGNOSIS — E7800 Pure hypercholesterolemia, unspecified: Secondary | ICD-10-CM

## 2015-02-25 DIAGNOSIS — I4510 Unspecified right bundle-branch block: Secondary | ICD-10-CM

## 2015-02-25 DIAGNOSIS — I471 Supraventricular tachycardia: Secondary | ICD-10-CM | POA: Diagnosis not present

## 2015-02-25 DIAGNOSIS — I10 Essential (primary) hypertension: Secondary | ICD-10-CM

## 2015-02-25 DIAGNOSIS — I2583 Coronary atherosclerosis due to lipid rich plaque: Principal | ICD-10-CM

## 2015-02-25 NOTE — Progress Notes (Signed)
[name]. [address], [place],   [address] Phone: [phone] Fax:  [phone]  Date:  02/25/2015   ID:  [name], DOB 08-15-1935, MRN [id number]  PCP:  Henrine Screws, MD   History of Present Illness: [name] is a 79 y.o. female with paroxysmal atrial tachycardia, no evidence of significant coronary artery disease on catheterization in 2012, here for followup. She stopped diltiazem in the past because of constipation. Atenolol has been used. This has helped with her palpitations. Her cholesterol is currently reasonably/very well controlled with very low dose Crestor. Previous stabbing pain in legs. Her last LDL is 83 (7/13). She does have mild carotid artery disease on last check, 20%.  Was walking and felt a funny feeling in chest.   Has had only mild palpitations. Lost her husband in 2014. She's also noticed some hair loss with atenolol. We have tried different medications but she has to decided to go back to the atenolol.  She had a skin reaction, several different doctor visits, 5 different doctors, unable to get to find diagnosis he states. Improving. This was over her abdomen.  08/15/14 - been having sharp cutting pain in left chest few seconds, left flank and LUQ abd all in different areas and times. Urology, cysts, blood in urine. No fevers, no chills. Was in ER, after bite in mid abdomen, 4 spots, red spots, then grew and twice as many. Prednisone given. Gates placed on abx after, then ER. Blood in stool??   02/25/15 - Dr. Inda Merlin prescribed depression  Medication, trazodone she states. She has not taken yet. She is concerned about potential side effects. She read that it could be harmful with cardiac arrhythmia. I reassured her that she may take this medication. She denies any chest discomfort. No new hair loss. She is still grieving the loss of her husband from 2 years ago.  Wt Readings from Last 3 Encounters:  02/25/15 153 lb 8 oz (69.627 kg)    08/15/14 150 lb (68.04 kg)  05/14/14 149 lb (67.586 kg)     Past Medical History  Diagnosis Date  . Hypertension   . Hypercholesteremia   . Colon polyp   . Occlusion and stenosis of carotid artery without mention of cerebral infarction   . CAD (coronary artery disease)   . Chest pain   . GERD (gastroesophageal reflux disease)   . IBS (irritable bowel syndrome)   . Paresthesia   . Neck pain   . Palpitation   . RBBB   . Situational depression   . DJD (degenerative joint disease)   . PVD (peripheral vascular disease)     Past Surgical History  Procedure Laterality Date  . Breast biopsy    . Tonsillectomy    . Tubal ligation    . Cholecystectomy    . Appendectomy      Current Outpatient Prescriptions  Medication Sig Dispense Refill  . acyclovir ointment (ZOVIRAX) 5 % Apply 1 application topically every 3 (three) hours.    Marland Kitchen aspirin 81 MG tablet Take 81 mg by mouth daily.    Marland Kitchen aspirin-acetaminophen-caffeine (EXCEDRIN MIGRAINE) 250-250-65 MG per tablet Take by mouth every 6 (six) hours as needed for headache.    . Cholecalciferol (VITAMIN D) 2000 UNITS CAPS Take 2,000 Units by mouth daily.    Marland Kitchen diltiazem (CARDIZEM CD) 120 MG 24 hr capsule Take 1 capsule (120 mg total) by mouth daily. 90 capsule 3  . Docusate Sodium (  COLACE PO) Take by mouth.    . lansoprazole (PREVACID) 15 MG capsule Take 15 mg by mouth every other day.    . lisinopril-hydrochlorothiazide (PRINZIDE,ZESTORETIC) 20-12.5 MG per tablet TAKE 1/2 TABLET TWICE A DAY 90 tablet 2  . nitroGLYCERIN (NITROSTAT) 0.4 MG SL tablet Place 0.4 mg under the tongue every 5 (five) minutes as needed for chest pain.    . Omega-3 Fatty Acids (FISH OIL PO) Take by mouth.    . Psyllium (METAMUCIL PO) Take by mouth.    . rosuvastatin (CRESTOR) 5 MG tablet Take 1 tablet (5 mg total) by mouth as directed. (Patient taking differently: Take 5 mg by mouth as directed. Taking 1/4 Tablet daily) 90 tablet 3  . traZODone (DESYREL) 50 MG tablet       No current facility-administered medications for this visit.    Allergies:    Allergies  Allergen Reactions  . Ceftin [Cefuroxime Axetil]     Double vision and joint pain  . Ciprofloxacin Hcl     Pain in stomach, bleeding.    Social History:  The patient  reports that she has quit smoking. She does not have any smokeless tobacco history on file. She reports that she does not drink alcohol or use illicit drugs.   ROS:  Please see the history of present illness. Recent skin rash, extensive workup unremarkable.  No CP, no bleeding, no chest pain, no orthopnea    PHYSICAL EXAM: VS:  BP 132/70 mmHg  Pulse 65  Ht 5\' 4"  (1.626 m)  Wt 153 lb 8 oz (69.627 kg)  BMI 26.34 kg/m2 Well nourished, well developed, in no acute distressmildly anxious HEENT: normal Neck: no JVD Cardiac:  normal S1, S2; RRR; no murmur Lungs:  clear to auscultation bilaterally, no wheezing, rhonchi or rales Abd: soft, nontender, no hepatomegaly Ext: no edema Skin: warm and dry Neuro: no focal abnormalities noted  EKG:  Today:  02/25/15-sinus rhythm, 65, right bundle branch block personally viewed-prior 07/27/11-sinus rhythm rate 64, right bundle branch block, chronic    Cardiac catheterization-03/16/11-no CAD. Tortuous vessels. EF 70%.   Labs: 07/24/13-ALT 18, LDL 84.  ASSESSMENT AND PLAN:  1. Paroxysmal atrial tachycardia-she decided to go back to atenolol. 2. Atypical chest pain-sharp, fleeting pain. Reassurance has been given. Likely musculoskeletal cramping. Once again reassured her with prior cardiac catheterization. 3. Right bundle branch block/sinus bradycardia-monitor. No syncope. I would not like to increase atenolol. Low-dose atenolol seems to be controlling her paroxysmal atrial tachycardia fairly well. See below, we are going to try diltiazem 4. Mild right carotid stenosis, 50-69%. Scan on 05/06/14. Continue with statin. 5. Hair loss-atenolol? Now on calcium channel blocker given her paroxysmal  atrial tachycardia. 6. Hypertension- well controlled.  7. Hyperlipidemia-extremely low-dose Crestor.She cuts into one fourth tablet. LDL  Excellent on recent lab work. She showed me Dr. Inda Merlin results. Liver functions normal.     Signed, Candee Furbish, MD [facility]  02/25/2015 1:35 PM

## 2015-02-25 NOTE — Patient Instructions (Signed)
Medication Instructions:  Your physician recommends that you continue on your current medications as directed. Please refer to the Current Medication list given to you today.  Follow-Up: Follow up in 6 months with Dr. Skains.  You will receive a letter in the mail 2 months before you are due.  Please call us when you receive this letter to schedule your follow up appointment.  Thank you for choosing [facility]!!     

## 2015-03-20 DIAGNOSIS — Z124 Encounter for screening for malignant neoplasm of cervix: Secondary | ICD-10-CM | POA: Diagnosis not present

## 2015-03-20 DIAGNOSIS — N390 Urinary tract infection, site not specified: Secondary | ICD-10-CM | POA: Diagnosis not present

## 2015-03-20 DIAGNOSIS — Z6826 Body mass index (BMI) 26.0-26.9, adult: Secondary | ICD-10-CM | POA: Diagnosis not present

## 2015-03-20 DIAGNOSIS — Z01419 Encounter for gynecological examination (general) (routine) without abnormal findings: Secondary | ICD-10-CM | POA: Diagnosis not present

## 2015-03-20 DIAGNOSIS — N760 Acute vaginitis: Secondary | ICD-10-CM | POA: Diagnosis not present

## 2015-03-27 ENCOUNTER — Other Ambulatory Visit: Payer: Self-pay | Admitting: *Deleted

## 2015-03-27 MED ORDER — NITROGLYCERIN 0.4 MG SL SUBL: 0.4000 mg | SUBLINGUAL_TABLET | SUBLINGUAL | Status: DC | PRN

## 2015-03-31 DIAGNOSIS — I10 Essential (primary) hypertension: Secondary | ICD-10-CM | POA: Diagnosis not present

## 2015-03-31 DIAGNOSIS — Z0000 Encounter for general adult medical examination without abnormal findings: Secondary | ICD-10-CM | POA: Diagnosis not present

## 2015-03-31 DIAGNOSIS — M549 Dorsalgia, unspecified: Secondary | ICD-10-CM | POA: Diagnosis not present

## 2015-03-31 DIAGNOSIS — G4700 Insomnia, unspecified: Secondary | ICD-10-CM | POA: Diagnosis not present

## 2015-03-31 DIAGNOSIS — R252 Cramp and spasm: Secondary | ICD-10-CM | POA: Diagnosis not present

## 2015-05-25 ENCOUNTER — Emergency Department (HOSPITAL_BASED_OUTPATIENT_CLINIC_OR_DEPARTMENT_OTHER)
Admission: EM | Admit: 2015-05-25 | Discharge: 2015-05-25 | Disposition: A | Discharge status: Home / Self Care | Payer: Medicare Other | Attending: Emergency Medicine | Admitting: Emergency Medicine

## 2015-05-25 ENCOUNTER — Encounter (HOSPITAL_BASED_OUTPATIENT_CLINIC_OR_DEPARTMENT_OTHER): Payer: Self-pay | Admitting: *Deleted

## 2015-05-25 DIAGNOSIS — M1990 Unspecified osteoarthritis, unspecified site: Secondary | ICD-10-CM | POA: Insufficient documentation

## 2015-05-25 DIAGNOSIS — Z87891 Personal history of nicotine dependence: Secondary | ICD-10-CM | POA: Insufficient documentation

## 2015-05-25 DIAGNOSIS — Z86010 Personal history of colonic polyps: Secondary | ICD-10-CM | POA: Insufficient documentation

## 2015-05-25 DIAGNOSIS — Z79899 Other long term (current) drug therapy: Secondary | ICD-10-CM | POA: Insufficient documentation

## 2015-05-25 DIAGNOSIS — I10 Essential (primary) hypertension: Secondary | ICD-10-CM | POA: Diagnosis not present

## 2015-05-25 DIAGNOSIS — E7800 Pure hypercholesterolemia, unspecified: Secondary | ICD-10-CM | POA: Insufficient documentation

## 2015-05-25 DIAGNOSIS — F4321 Adjustment disorder with depressed mood: Secondary | ICD-10-CM | POA: Insufficient documentation

## 2015-05-25 DIAGNOSIS — J029 Acute pharyngitis, unspecified: Secondary | ICD-10-CM | POA: Diagnosis not present

## 2015-05-25 DIAGNOSIS — K219 Gastro-esophageal reflux disease without esophagitis: Secondary | ICD-10-CM | POA: Insufficient documentation

## 2015-05-25 DIAGNOSIS — I2510 Atherosclerotic heart disease of native coronary artery without angina pectoris: Secondary | ICD-10-CM | POA: Insufficient documentation

## 2015-05-25 DIAGNOSIS — M542 Cervicalgia: Secondary | ICD-10-CM | POA: Diagnosis not present

## 2015-05-25 DIAGNOSIS — Z7982 Long term (current) use of aspirin: Secondary | ICD-10-CM | POA: Diagnosis not present

## 2015-05-25 LAB — RAPID STREP SCREEN (MED CTR MEBANE ONLY): Streptococcus, Group A Screen (Direct): NEGATIVE

## 2015-05-25 MED ORDER — ACETAMINOPHEN 325 MG PO TABS
650.0000 mg | ORAL_TABLET | Freq: Once | ORAL | Status: AC | PRN
  Administered 2015-05-25: 650 mg via ORAL
  Filled 2015-05-25: qty 2

## 2015-05-25 NOTE — ED Notes (Signed)
Oral Temp (after tylenol PO) 98.67F

## 2015-05-25 NOTE — Discharge Instructions (Signed)
Please read and follow all provided instructions.  Your diagnoses today include:  1. Pharyngitis     Tests performed today include:  Strep test: was negative for strep throat  Strep culture: you will be notified if this comes back positive  Vital signs. See below for your results today.   Medications prescribed:   None  Home care instructions:  Please read the educational materials provided and follow any instructions contained in this packet.  Follow-up instructions: Please follow-up with your primary care provider as needed for further evaluation of your symptoms.  Return instructions:   Please return to the Emergency Department if you experience worsening symptoms.   Return if you have worsening problems swallowing, your neck becomes swollen, you cannot swallow your saliva or your voice becomes muffled.   Return with high persistent fever, persistent vomiting, or if you have trouble breathing.   Please return if you have any other emergent concerns.  Additional Information:  Your vital signs today were: BP 158/77 mmHg   Pulse 78   Temp(Src) 101.9 F (38.8 C) (Oral)   Resp 18   Ht 5\' 4"  (1.626 m)   Wt 68.04 kg   BMI 25.73 kg/m2   SpO2 99% If your blood pressure (BP) was elevated above 135/85 this visit, please have this repeated by your doctor within one month. --------------

## 2015-05-25 NOTE — ED Notes (Signed)
Reports sore throat since yesterday with generalized body aches. Reports 2 family have had strep. Temp 101.9 in triage

## 2015-05-25 NOTE — ED Notes (Signed)
States appetite poor due to sore throat

## 2015-05-25 NOTE — ED Notes (Signed)
States has had sore throat since yesterday, was exposed to family that had strep throat recently

## 2015-05-25 NOTE — ED Provider Notes (Signed)
CSN: [id number]     Arrival date & time 05/25/15  1104 History   First MD Initiated Contact with Patient 05/25/15 1204     Chief Complaint  Patient presents with  . Sore Throat     (Consider location/radiation/quality/duration/timing/severity/associated sxs/prior Treatment) HPI Comments: Patient presents with complaint of sore throat the past 2 days. Patient has had pain with swallowing and generalized aches last night. Patient was exposed to family members who have strep throat. Patient has had fever. No nausea, vomiting, or diarrhea. Patient denies chest pain or shortness of breath. She has had some mild swelling and tenderness to her right neck but no difficulty moving her neck. No headache or confusion. Onset of symptoms acute. Course is constant. Patient is been taking Tylenol at home with some relief.   Patient is a 79 y.o. female presenting with pharyngitis. The history is provided by the patient and a relative.  Sore Throat Associated symptoms include a fever, neck pain and a sore throat. Pertinent negatives include no abdominal pain, chills, congestion, coughing, fatigue, headaches, myalgias, nausea, rash or vomiting.    Past Medical History  Diagnosis Date  . Hypertension   . Hypercholesteremia   . Colon polyp   . Occlusion and stenosis of carotid artery without mention of cerebral infarction   . CAD (coronary artery disease)   . Chest pain   . GERD (gastroesophageal reflux disease)   . IBS (irritable bowel syndrome)   . Paresthesia   . Neck pain   . Palpitation   . RBBB   . Situational depression   . DJD (degenerative joint disease)   . PVD (peripheral vascular disease) [facility])    Past Surgical History  Procedure Laterality Date  . Breast biopsy    . Tonsillectomy    . Tubal ligation    . Cholecystectomy    . Appendectomy     Family History  Problem Relation Age of Onset  . Heart attack Father   . Kidney disease Father    Social History  Substance Use Topics   . Smoking status: Former Research scientist (life sciences)  . Smokeless tobacco: Never Used  . Alcohol Use: No   OB History    No data available     Review of Systems  Constitutional: Positive for fever. Negative for chills and fatigue.  HENT: Positive for sore throat. Negative for congestion, ear pain, rhinorrhea and sinus pressure.   Eyes: Negative for redness.  Respiratory: Negative for cough and wheezing.   Gastrointestinal: Negative for nausea, vomiting, abdominal pain and diarrhea.  Genitourinary: Negative for dysuria.  Musculoskeletal: Positive for neck pain. Negative for myalgias and neck stiffness.  Skin: Negative for rash.  Neurological: Negative for headaches.  Hematological: Positive for adenopathy.      Allergies  Ceftin and Ciprofloxacin hcl  Home Medications   Prior to Admission medications   Medication Sig Start Date End Date Taking? Authorizing Provider  acyclovir ointment (ZOVIRAX) 5 % Apply 1 application topically every 3 (three) hours.   Yes Historical Provider, MD  aspirin 81 MG tablet Take 81 mg by mouth daily.   Yes Historical Provider, MD  aspirin-acetaminophen-caffeine (EXCEDRIN MIGRAINE) [id number] MG per tablet Take by mouth every 6 (six) hours as needed for headache.   Yes Historical Provider, MD  Cholecalciferol (VITAMIN D) 2000 UNITS CAPS Take 2,000 Units by mouth daily.   Yes Historical Provider, MD  diltiazem (CARDIZEM CD) 120 MG 24 hr capsule Take 1 capsule (120 mg total) by mouth daily. 10/08/14  Yes Jerline Pain, MD  Docusate Sodium (COLACE PO) Take by mouth.   Yes Historical Provider, MD  lansoprazole (PREVACID) 15 MG capsule Take 15 mg by mouth every other day.   Yes Historical Provider, MD  lisinopril-hydrochlorothiazide (PRINZIDE,ZESTORETIC) 20-12.5 MG per tablet TAKE 1/2 TABLET TWICE A DAY 10/07/14  Yes Jerline Pain, MD  nitroGLYCERIN (NITROSTAT) 0.4 MG SL tablet Place 1 tablet (0.4 mg total) under the tongue every 5 (five) minutes as needed for chest pain.  03/27/15  Yes Jerline Pain, MD  Omega-3 Fatty Acids (FISH OIL PO) Take by mouth.   Yes Historical Provider, MD  Psyllium (METAMUCIL PO) Take by mouth.   Yes Historical Provider, MD  rosuvastatin (CRESTOR) 5 MG tablet Take 1 tablet (5 mg total) by mouth as directed. Patient taking differently: Take 5 mg by mouth as directed. Taking 1/4 Tablet daily 05/17/14  Yes Jerline Pain, MD  traZODone (DESYREL) 50 MG tablet  02/12/15   Historical Provider, MD   BP 158/77 mmHg  Pulse 78  Temp(Src) 101.9 F (38.8 C) (Oral)  Resp 18  Ht 5\' 4"  (1.626 m)  Wt 68.04 kg  BMI 25.73 kg/m2  SpO2 99%   Physical Exam  Constitutional: She appears well-developed and well-nourished.  HENT:  Head: Normocephalic and atraumatic.  Right Ear: Tympanic membrane, external ear and ear canal normal.  Left Ear: Tympanic membrane, external ear and ear canal normal.  Nose: Nose normal. No mucosal edema or rhinorrhea.  Mouth/Throat: Uvula is midline and mucous membranes are normal. Mucous membranes are not dry. No oral lesions. No trismus in the jaw. No uvula swelling. Posterior oropharyngeal erythema present. No oropharyngeal exudate, posterior oropharyngeal edema or tonsillar abscesses.  Eyes: Conjunctivae are normal. Right eye exhibits no discharge. Left eye exhibits no discharge.  Neck: Normal range of motion. Neck supple.  Full ROM in neck without pain  Cardiovascular: Normal rate, regular rhythm and normal heart sounds.   Pulmonary/Chest: Effort normal and breath sounds normal. No respiratory distress. She has no wheezes. She has no rales.  Abdominal: Soft. There is no tenderness.  Lymphadenopathy:    She has cervical adenopathy (R cervical adenopathy).  Neurological: She is alert.  Skin: Skin is warm and dry.  Psychiatric: She has a normal mood and affect.  Nursing note and vitals reviewed.   ED Course  Procedures (including critical care time) Labs Review Labs Reviewed  RAPID STREP SCREEN (NOT AT [facility])   CULTURE, GROUP A STREP    Imaging Review No results found. I have personally reviewed and evaluated these images and lab results as part of my medical decision-making.   EKG Interpretation None       12:59 PM Patient seen and examined. Family updated on results.  Vital signs reviewed and are as follows: BP 158/77 mmHg  Pulse 78  Temp(Src) 101.9 F (38.8 C) (Oral)  Resp 18  Ht 5\' 4"  (1.626 m)  Wt 68.04 kg  BMI 25.73 kg/m2  SpO2 99%  Patient discussed with and seen by Dr. Winfred Leeds. Patient appears well nontoxic. Will treat with Tylenol.  Encourage return to the emergency department with inability to swallow, high fever, pain with movement of neck. Patient verbalizes understanding and agrees with plan.  MDM   Final diagnoses:  Pharyngitis   Patient with source throat, recent strep exposure, fever. Strep screen negative/positive. Antibiotics not indicated/prescribed.  No peritonsillar abscess seen. No concern for retro-pharyngeal abscesses patient has full range of motion of neck. She does  not appear toxic. Do not feel that this represents anginal equivalent.      Carlisle Cater, PA-C 05/25/15 [place], MD 05/25/15 1654

## 2015-05-25 NOTE — ED Provider Notes (Addendum)
Complain of sore throat and pain with swallowing onset yesterday. No other associated symptoms. On exam alert nontoxic calyx secretions well. Oropharynx is reddened. No exudate. Tonsils not enlarged. Uvula midline. Neck tender anterior cervical lymph nodes bilaterally  Orlie Dakin, MD 05/25/15 1252  Orlie Dakin, MD 05/25/15 1654

## 2015-05-27 LAB — CULTURE, GROUP A STREP: Strep A Culture: NEGATIVE

## 2015-06-09 DIAGNOSIS — H2513 Age-related nuclear cataract, bilateral: Secondary | ICD-10-CM | POA: Diagnosis not present

## 2015-07-07 ENCOUNTER — Other Ambulatory Visit: Payer: Self-pay | Admitting: Cardiology

## 2015-07-08 DIAGNOSIS — M5090 Cervical disc disorder, unspecified, unspecified cervical region: Secondary | ICD-10-CM | POA: Diagnosis not present

## 2015-07-08 DIAGNOSIS — M542 Cervicalgia: Secondary | ICD-10-CM | POA: Diagnosis not present

## 2015-07-08 DIAGNOSIS — M5412 Radiculopathy, cervical region: Secondary | ICD-10-CM | POA: Diagnosis not present

## 2015-07-18 DIAGNOSIS — M542 Cervicalgia: Secondary | ICD-10-CM | POA: Diagnosis not present

## 2015-07-23 DIAGNOSIS — M542 Cervicalgia: Secondary | ICD-10-CM | POA: Diagnosis not present

## 2015-07-25 DIAGNOSIS — M542 Cervicalgia: Secondary | ICD-10-CM | POA: Diagnosis not present

## 2015-07-30 DIAGNOSIS — M542 Cervicalgia: Secondary | ICD-10-CM | POA: Diagnosis not present

## 2015-08-01 DIAGNOSIS — M542 Cervicalgia: Secondary | ICD-10-CM | POA: Diagnosis not present

## 2015-08-04 ENCOUNTER — Other Ambulatory Visit: Payer: Self-pay

## 2015-08-04 DIAGNOSIS — Z1231 Encounter for screening mammogram for malignant neoplasm of breast: Secondary | ICD-10-CM

## 2015-08-04 IMAGING — US US CAROTID DUPLEX BILAT
1 series · 13 of 24 positions shown · non-contrast
Comparison: None.

CLINICAL DATA: History tobacco use

EXAM:
BILATERAL CAROTID DUPLEX ULTRASOUND
TECHNIQUE: Gray scale imaging, color Doppler and duplex ultrasound were
performed of bilateral carotid and vertebral arteries in the neck.

[Series 1: us carotid duplex bilat · 0.07mm/px · 13 of 24 slices shown]
[im 1/24]
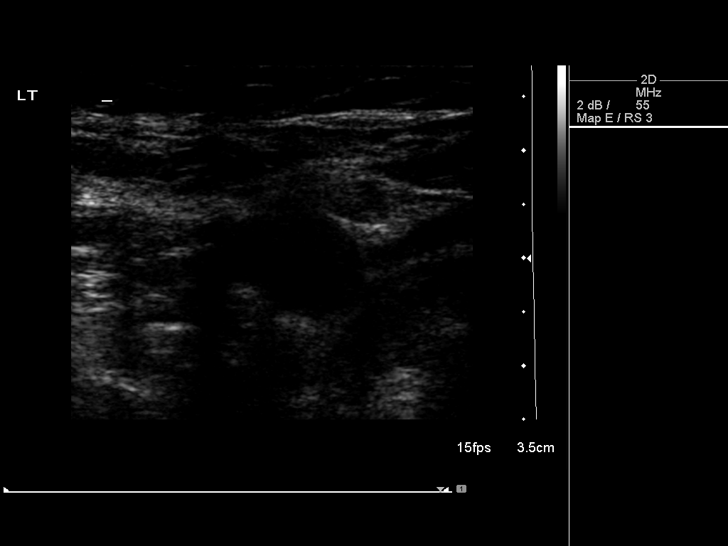
[im 3/24]
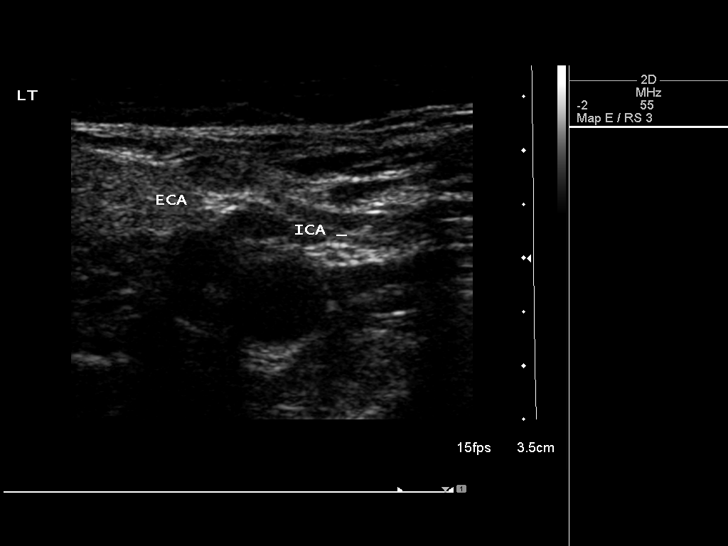
[im 5/24]
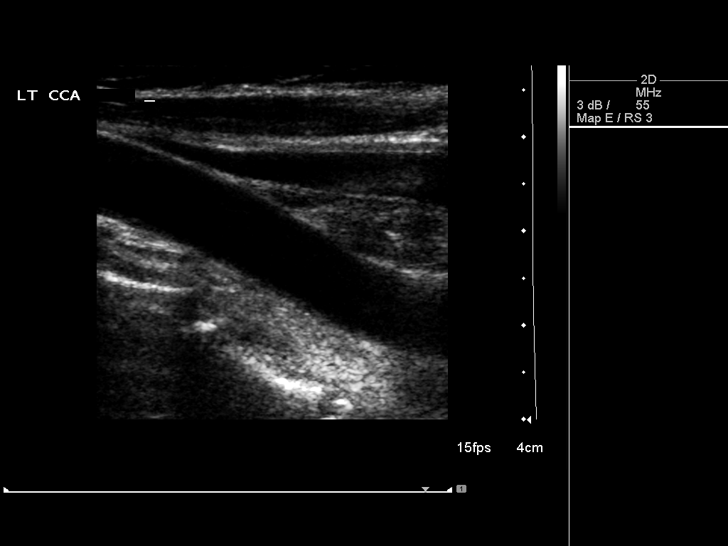
[im 7/24]
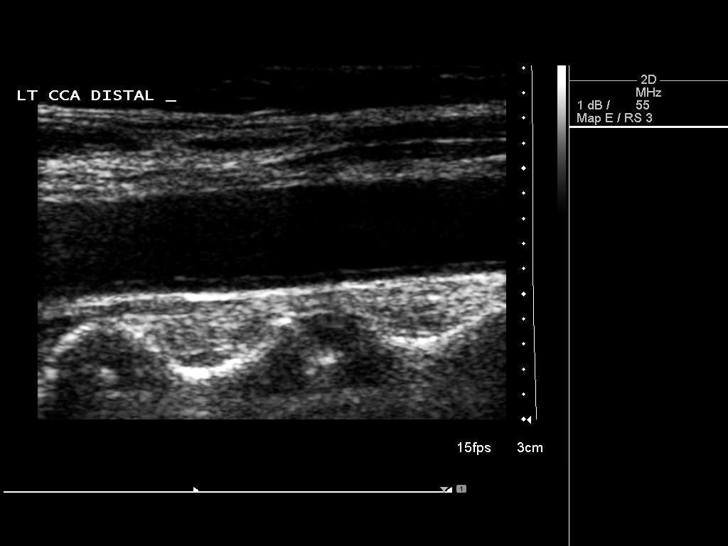
[im 9/24]
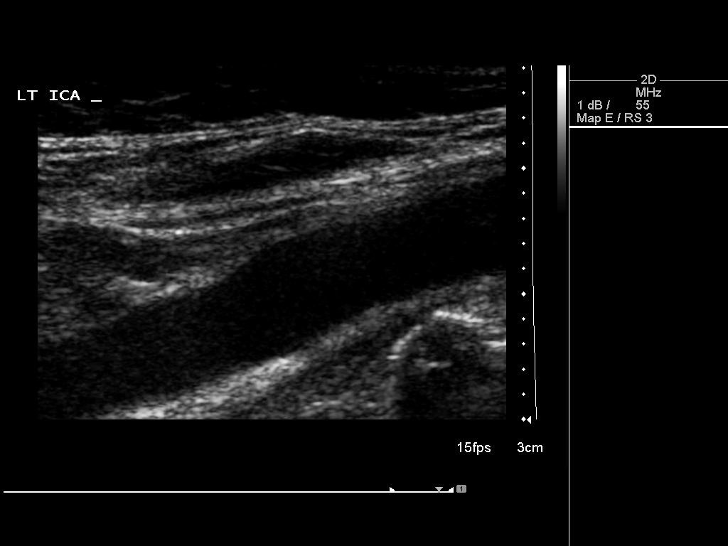
[im 11/24]
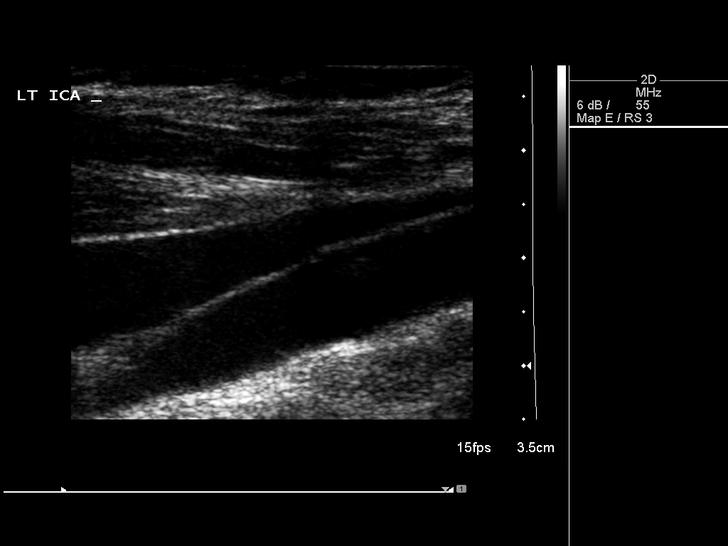
[im 13/24]
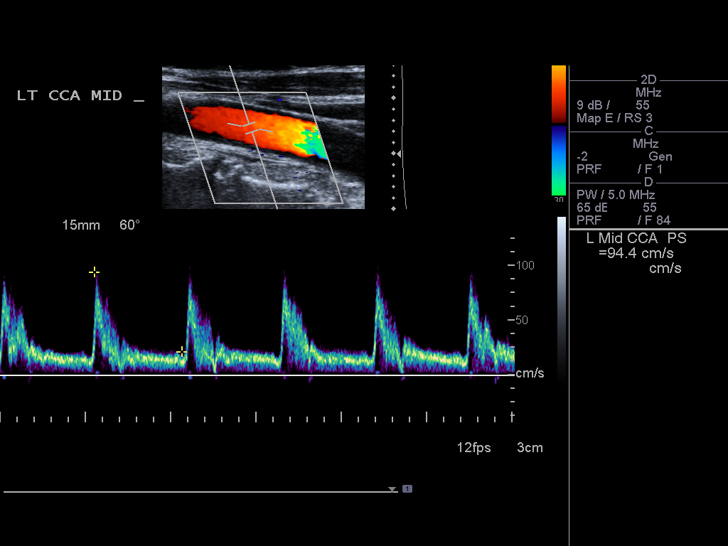
[im 14/24]
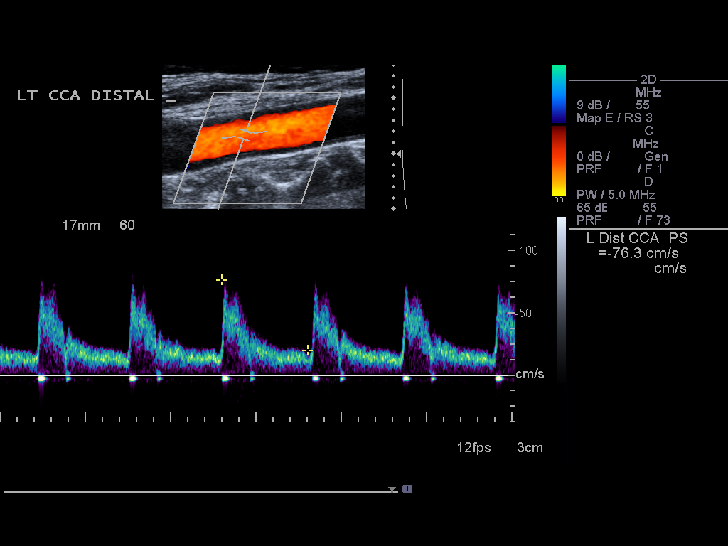
[im 16/24]
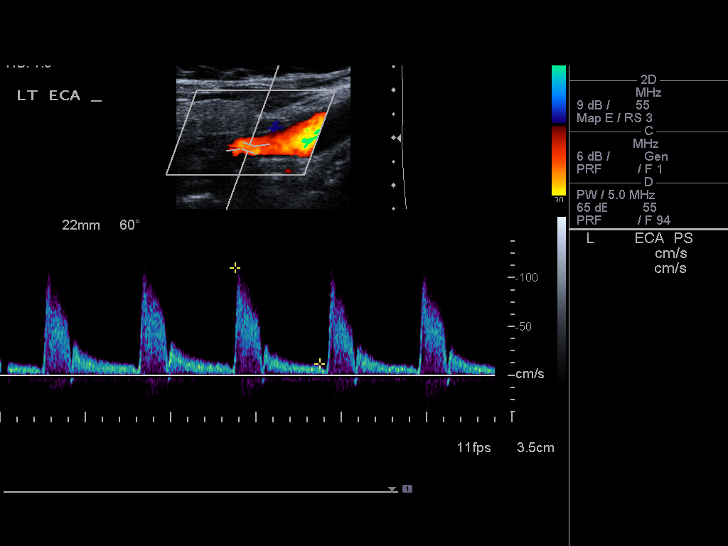
[im 18/24]
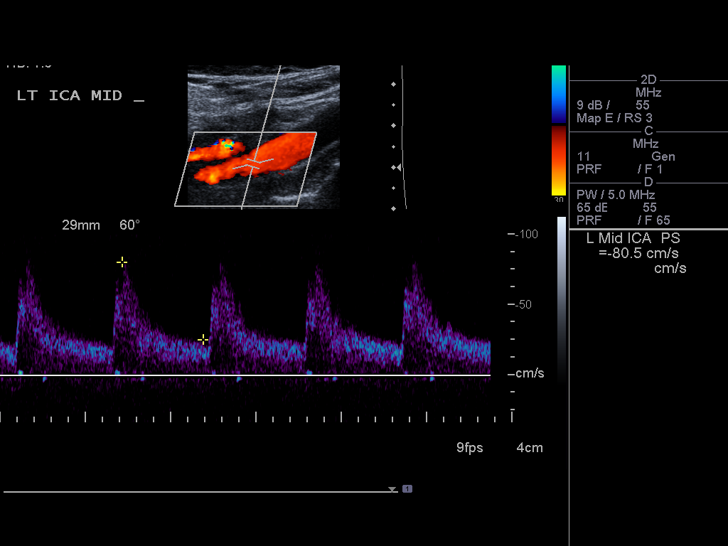
[im 20/24]
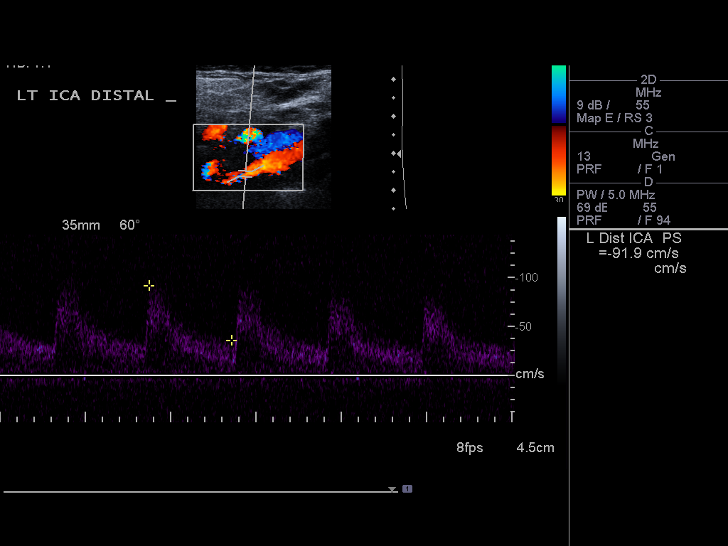
[im 22/24]
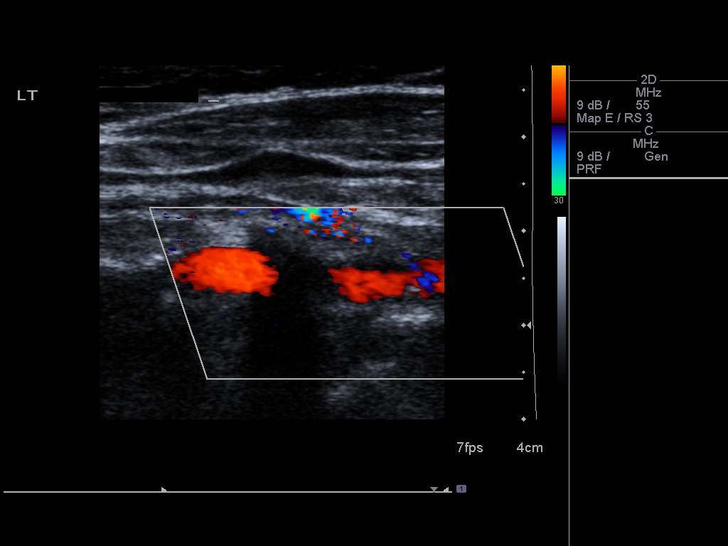
[im 24/24]
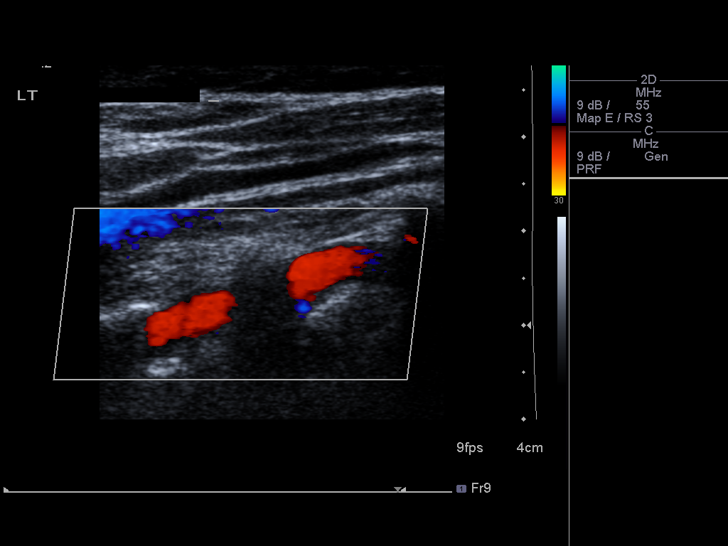

[13 of 24 positions shown; findings below may reference images not displayed]

FINDINGS: Criteria: Quantification of carotid stenosis is based on velocity
parameters that correlate the residual internal carotid diameter
with NASCET-based stenosis levels, using the diameter of the distal
internal carotid lumen as the denominator for stenosis measurement.

The following velocity measurements were obtained:

RIGHT

ICA:  130/47 cm/sec

CCA:  98/16 cm/sec

SYSTOLIC ICA/CCA RATIO:

DIASTOLIC ICA/CCA RATIO:

ECA:  80 cm/sec

LEFT

ICA:  92/36 cm/sec

CCA:  122/22 cm/sec

SYSTOLIC ICA/CCA RATIO:

DIASTOLIC ICA/CCA RATIO:

ECA:  110 cm/sec

RIGHT CAROTID ARTERY: The grayscale images show mild intimal
thickening within the common carotid artery. Minimal plaque
formation is noted in the region of the carotid bulb and proximal
internal carotid artery. The waveforms, velocities and flow velocity
ratios show no evidence of mild stenosis in the low end of the
50-69% spectrum.

RIGHT VERTEBRAL ARTERY:  Antegrade in nature.

LEFT CAROTID ARTERY: Minimal plaque formation is noted in the
carotid bulb on the left. The waveforms, velocities and flow
velocity ratios show no evidence of focal hemodynamically
significant stenosis.

LEFT VERTEBRAL ARTERY:  Antegrade in nature.
IMPRESSION: Mild stenosis is noted in the low end of the 50-69% range on the
right.

No focal stenosis is noted on the left.

## 2015-08-15 DIAGNOSIS — M5090 Cervical disc disorder, unspecified, unspecified cervical region: Secondary | ICD-10-CM | POA: Diagnosis not present

## 2015-08-15 DIAGNOSIS — M542 Cervicalgia: Secondary | ICD-10-CM | POA: Diagnosis not present

## 2015-08-15 DIAGNOSIS — M5412 Radiculopathy, cervical region: Secondary | ICD-10-CM | POA: Diagnosis not present

## 2015-08-15 DIAGNOSIS — M5031 Other cervical disc degeneration,  high cervical region: Secondary | ICD-10-CM | POA: Diagnosis not present

## 2015-08-27 ENCOUNTER — Encounter: Payer: Self-pay | Admitting: *Deleted

## 2015-09-01 ENCOUNTER — Encounter: Payer: Self-pay | Admitting: Cardiology

## 2015-09-01 ENCOUNTER — Ambulatory Visit (INDEPENDENT_AMBULATORY_CARE_PROVIDER_SITE_OTHER): Payer: Medicare Other | Admitting: Cardiology

## 2015-09-01 VITALS — BP 126/70 | HR 62 | Ht 64.0 in | Wt 148.6 lb

## 2015-09-01 DIAGNOSIS — I2583 Coronary atherosclerosis due to lipid rich plaque: Secondary | ICD-10-CM

## 2015-09-01 DIAGNOSIS — I779 Disorder of arteries and arterioles, unspecified: Secondary | ICD-10-CM | POA: Diagnosis not present

## 2015-09-01 DIAGNOSIS — I471 Supraventricular tachycardia: Secondary | ICD-10-CM

## 2015-09-01 DIAGNOSIS — L659 Nonscarring hair loss, unspecified: Secondary | ICD-10-CM | POA: Diagnosis not present

## 2015-09-01 DIAGNOSIS — I739 Peripheral vascular disease, unspecified: Secondary | ICD-10-CM | POA: Diagnosis not present

## 2015-09-01 DIAGNOSIS — I2510 Atherosclerotic heart disease of native coronary artery without angina pectoris: Secondary | ICD-10-CM | POA: Diagnosis not present

## 2015-09-01 DIAGNOSIS — I4719 Other supraventricular tachycardia: Secondary | ICD-10-CM

## 2015-09-01 NOTE — Progress Notes (Signed)
[place]. [address], [place]  [address] Phone: [phone] Fax:  [phone]  Date:  09/01/2015   ID:  [name], DOB 04-17-1936, MRN [id number]  PCP:  Henrine Screws, MD   History of Present Illness: [name] is a 80 y.o. female with paroxysmal atrial tachycardia, no evidence of significant coronary artery disease on catheterization in 2012, here for followup. She stopped diltiazem in the past because of constipation. Atenolol has been used. This has helped with her palpitations. Her cholesterol is currently reasonably/very well controlled with very low dose Crestor. Previous stabbing pain in legs. Her last LDL is 83 (7/13). She does have mild carotid artery disease on last check, 20%.  Was walking and felt a funny feeling in chest.   Has had only mild palpitations. Lost her husband in 2014. She's also noticed some hair loss with atenolol. We have tried different medications but she has to decided to go back to the atenolol.  She had a skin reaction, several different doctor visits, 5 different doctors, unable to get to find diagnosis he states. Improving. This was over her abdomen.  08/15/14 - been having sharp cutting pain in left chest few seconds, left flank and LUQ abd all in different areas and times. Urology, cysts, blood in urine. No fevers, no chills. Was in ER, after bite in mid abdomen, 4 spots, red spots, then grew and twice as many. Prednisone given. Gates placed on abx after, then ER. Blood in stool??   02/25/15 - Dr. Inda Merlin prescribed depression  Medication, trazodone she states. She has not taken yet. She is concerned about potential side effects. She read that it could be harmful with cardiac arrhythmia. I reassured her that she may take this medication. She denies any chest discomfort. No new hair loss. She is still grieving the loss of her husband from 2 years ago.  09/01/15-Dizziness. periodic-then changes the subject to knee pain. No  chest pain, no significant shortness of breath. No syncope. She has moved her bedroom down to the first floor after her husband died. Tolerating statin  Wt Readings from Last 3 Encounters:  09/01/15 148 lb 9.6 oz (67.405 kg)  05/25/15 150 lb (68.04 kg)  02/25/15 153 lb 8 oz (69.627 kg)     Past Medical History  Diagnosis Date  . Hypertension   . Hypercholesteremia   . Colon polyp   . Occlusion and stenosis of carotid artery without mention of cerebral infarction   . CAD (coronary artery disease)   . Chest pain   . GERD (gastroesophageal reflux disease)   . IBS (irritable bowel syndrome)   . Paresthesia   . Neck pain   . Palpitation   . RBBB   . Situational depression   . DJD (degenerative joint disease)   . PVD (peripheral vascular disease) [facility])     Past Surgical History  Procedure Laterality Date  . Breast biopsy    . Tonsillectomy    . Tubal ligation Bilateral   . Laparoscopic cholecystectomy    . Appendectomy    . Rotator cuff repair      Current Outpatient Prescriptions  Medication Sig Dispense Refill  . acyclovir ointment (ZOVIRAX) 5 % Apply 1 application topically as directed.    Marland Kitchen aspirin 81 MG tablet Take 81 mg by mouth daily.    Marland Kitchen aspirin-acetaminophen-caffeine (EXCEDRIN MIGRAINE) 250-250-65 MG per tablet Take by mouth every 6 (six) hours as needed for headache.    Marland Kitchen  Cholecalciferol (VITAMIN D) 2000 units CAPS Take 1 capsule by mouth daily.    Marland Kitchen diltiazem (CARDIZEM CD) 120 MG 24 hr capsule Take 1 capsule (120 mg total) by mouth daily. 90 capsule 3  . docusate sodium (COLACE) 100 MG capsule Take 100-200 mg by mouth daily as needed for mild constipation or moderate constipation.    . lansoprazole (PREVACID) 15 MG capsule Take 15 mg by mouth every other day.    . lisinopril-hydrochlorothiazide (PRINZIDE,ZESTORETIC) 20-12.5 MG tablet Take 0.5 tablets by mouth 2 (two) times daily. 90 tablet 1  . nitroGLYCERIN (NITROSTAT) 0.4 MG SL tablet Place 1 tablet (0.4 mg  total) under the tongue every 5 (five) minutes as needed for chest pain. 25 tablet 5  . Omega-3 Fatty Acids (FISH OIL PO) Take 2 capsules by mouth daily.    . psyllium (METAMUCIL) 58.6 % powder Take 1 packet by mouth daily.    . rosuvastatin (CRESTOR) 5 MG tablet Take 10 mg by mouth daily.    . traZODone (DESYREL) 50 MG tablet      No current facility-administered medications for this visit.    Allergies:    Allergies  Allergen Reactions  . Ceftin [Cefuroxime Axetil]     Double vision and joint pain  . Ciprofloxacin Hcl     Pain in stomach, bleeding.    Social History:  The patient  reports that she has quit smoking. She has never used smokeless tobacco. She reports that she does not drink alcohol or use illicit drugs.   ROS:  Please see the history of present illness.   No CP, no bleeding, no chest pain, no orthopnea    PHYSICAL EXAM: VS:  BP 126/70 mmHg  Pulse 62  Ht 5\' 4"  (1.626 m)  Wt 148 lb 9.6 oz (67.405 kg)  BMI 25.49 kg/m2  SpO2 98% Well nourished, well developed, in no acute distressmildly anxious HEENT: normal Neck: no JVD Cardiac:  normal S1, S2; RRR; no murmur Lungs:  clear to auscultation bilaterally, no wheezing, rhonchi or rales Abd: soft, nontender, no hepatomegaly Ext: trace edema Skin: warm and dry Neuro: no focal abnormalities noted  EKG:  Today:  02/25/15-sinus rhythm, 65, right bundle branch block personally viewed-prior 07/27/11-sinus rhythm rate 64, right bundle branch block, chronic    Cardiac catheterization-03/16/11-no CAD. Tortuous vessels. EF 70%.   Labs:LDL 82  ASSESSMENT AND PLAN:  1. Paroxysmal atrial tachycardia-off atenolol and now on diltiazem.  2. Atypical chest pain-sharp, fleeting pain. Reassurance has been given. Likely musculoskeletal cramping. Once again reassured her with prior cardiac catheterization. 3. Right bundle branch block/sinus bradycardia-monitor. No syncope. I would not like to increase atenolol. Low-dose atenolol seems  to be controlling her paroxysmal atrial tachycardia fairly well. See below, we are going to try diltiazem 4. Mild right carotid stenosis, 50-69%. Scan on 05/06/14. Continue with statin. Repeat scan.  5. Hair loss-atenolol? Trying calcium channel blocker given her paroxysmal atrial tachycardia. No change 6. Hypertension- well controlled.  7. Hyperlipidemia-extremely low-dose Crestor.She cuts into one fourth tablet. LDL  Excellent on recent lab work. She showed me Dr. Inda Merlin results. Liver functions normal. LDL 82 doing well. 8. Dizziness-could be multifactorial. Cardiac workup reassuring. 9. One year follow up.     Signed, Candee Furbish, MD [facility]  09/01/2015 2:02 PM

## 2015-09-01 NOTE — Patient Instructions (Signed)
Medication Instructions:  The current medical regimen is effective;  continue present plan and medications.  Testing/Procedures: Your physician has requested that you have a carotid duplex. This test is an ultrasound of the carotid arteries in your neck. It looks at blood flow through these arteries that supply the brain with blood. Allow one hour for this exam. There are no restrictions or special instructions.  Follow-Up: Follow up in 1 year with Dr. Skains.  You will receive a letter in the mail 2 months before you are due.  Please call us when you receive this letter to schedule your follow up appointment.   If you need a refill on your cardiac medications before your next appointment, please call your pharmacy.  Thank you for choosing [facility]!!       

## 2015-09-08 ENCOUNTER — Ambulatory Visit (HOSPITAL_COMMUNITY)
Admission: RE | Admit: 2015-09-08 | Discharge: 2015-09-08 | Disposition: A | Discharge status: Home / Self Care | Payer: Medicare Other | Source: Ambulatory Visit | Attending: Cardiology | Admitting: Cardiology

## 2015-09-08 DIAGNOSIS — I6521 Occlusion and stenosis of right carotid artery: Secondary | ICD-10-CM | POA: Diagnosis not present

## 2015-09-08 DIAGNOSIS — E7800 Pure hypercholesterolemia, unspecified: Secondary | ICD-10-CM | POA: Insufficient documentation

## 2015-09-08 DIAGNOSIS — I779 Disorder of arteries and arterioles, unspecified: Secondary | ICD-10-CM

## 2015-09-08 DIAGNOSIS — I10 Essential (primary) hypertension: Secondary | ICD-10-CM | POA: Diagnosis not present

## 2015-09-08 DIAGNOSIS — R42 Dizziness and giddiness: Secondary | ICD-10-CM | POA: Diagnosis not present

## 2015-09-08 DIAGNOSIS — I739 Peripheral vascular disease, unspecified: Secondary | ICD-10-CM

## 2015-09-09 ENCOUNTER — Telehealth: Payer: Self-pay | Admitting: Cardiology

## 2015-09-09 NOTE — Telephone Encounter (Signed)
Reviewed results of testing with pt who states understanding. 

## 2015-09-09 NOTE — Telephone Encounter (Signed)
Returning a call about her test results

## 2015-09-12 ENCOUNTER — Ambulatory Visit
Admission: RE | Admit: 2015-09-12 | Discharge: 2015-09-12 | Disposition: A | Discharge status: Home / Self Care | Payer: Medicare Other | Source: Ambulatory Visit

## 2015-09-12 DIAGNOSIS — Z1231 Encounter for screening mammogram for malignant neoplasm of breast: Secondary | ICD-10-CM | POA: Diagnosis not present

## 2015-09-15 DIAGNOSIS — M1711 Unilateral primary osteoarthritis, right knee: Secondary | ICD-10-CM | POA: Diagnosis not present

## 2015-09-15 DIAGNOSIS — M25561 Pain in right knee: Secondary | ICD-10-CM | POA: Diagnosis not present

## 2015-09-19 DIAGNOSIS — H9202 Otalgia, left ear: Secondary | ICD-10-CM | POA: Diagnosis not present

## 2015-09-19 DIAGNOSIS — J310 Chronic rhinitis: Secondary | ICD-10-CM | POA: Diagnosis not present

## 2015-09-19 DIAGNOSIS — H6993 Unspecified Eustachian tube disorder, bilateral: Secondary | ICD-10-CM | POA: Diagnosis not present

## 2015-09-25 DIAGNOSIS — R040 Epistaxis: Secondary | ICD-10-CM | POA: Diagnosis not present

## 2015-09-30 DIAGNOSIS — M1711 Unilateral primary osteoarthritis, right knee: Secondary | ICD-10-CM | POA: Diagnosis not present

## 2015-10-05 ENCOUNTER — Other Ambulatory Visit: Payer: Self-pay | Admitting: Cardiology

## 2015-10-18 DIAGNOSIS — M1711 Unilateral primary osteoarthritis, right knee: Secondary | ICD-10-CM | POA: Diagnosis not present

## 2015-10-20 DIAGNOSIS — L821 Other seborrheic keratosis: Secondary | ICD-10-CM | POA: Diagnosis not present

## 2015-10-20 DIAGNOSIS — D485 Neoplasm of uncertain behavior of skin: Secondary | ICD-10-CM | POA: Diagnosis not present

## 2015-10-20 DIAGNOSIS — L84 Corns and callosities: Secondary | ICD-10-CM | POA: Diagnosis not present

## 2015-10-20 DIAGNOSIS — D2271 Melanocytic nevi of right lower limb, including hip: Secondary | ICD-10-CM | POA: Diagnosis not present

## 2015-10-20 DIAGNOSIS — D2272 Melanocytic nevi of left lower limb, including hip: Secondary | ICD-10-CM | POA: Diagnosis not present

## 2015-10-20 DIAGNOSIS — Z85828 Personal history of other malignant neoplasm of skin: Secondary | ICD-10-CM | POA: Diagnosis not present

## 2015-10-20 DIAGNOSIS — D1801 Hemangioma of skin and subcutaneous tissue: Secondary | ICD-10-CM | POA: Diagnosis not present

## 2015-10-23 DIAGNOSIS — M1711 Unilateral primary osteoarthritis, right knee: Secondary | ICD-10-CM | POA: Diagnosis not present

## 2015-12-11 DIAGNOSIS — M25561 Pain in right knee: Secondary | ICD-10-CM | POA: Diagnosis not present

## 2015-12-15 ENCOUNTER — Ambulatory Visit: Payer: Self-pay | Admitting: Orthopedic Surgery

## 2015-12-22 ENCOUNTER — Ambulatory Visit: Payer: Self-pay | Admitting: Orthopedic Surgery

## 2015-12-22 NOTE — H&P (Signed)
[name] is an 80 y.o. female.   Chief Complaint: Right knee pain HPI: The patient is a 80 year old female who presents today for follow up of their knee. The patient is being followed for their right knee pain. They are now month(s) out from when symptoms began. Symptoms reported today include: pain (discomfort) and locking. and report their pain level to be mild to moderate. Current treatment includes: NSAIDs (OTC). The following medication has been used for pain control: antiinflammatory medication (prn). The patient presents today following MRI.  Past Medical History  Diagnosis Date  . Hypertension   . Hypercholesteremia   . Colon polyp   . Occlusion and stenosis of carotid artery without mention of cerebral infarction   . CAD (coronary artery disease)   . Chest pain   . GERD (gastroesophageal reflux disease)   . IBS (irritable bowel syndrome)   . Paresthesia   . Neck pain   . Palpitation   . RBBB   . Situational depression   . DJD (degenerative joint disease)   . PVD (peripheral vascular disease) [facility])     Past Surgical History  Procedure Laterality Date  . Breast biopsy    . Tonsillectomy    . Tubal ligation Bilateral   . Laparoscopic cholecystectomy    . Appendectomy    . Rotator cuff repair      Family History  Problem Relation Age of Onset  . Heart attack Father   . Kidney disease Father    Social History:  reports that she has quit smoking. She has never used smokeless tobacco. She reports that she does not drink alcohol or use illicit drugs.  Allergies:  Allergies  Allergen Reactions  . Ceftin [Cefuroxime Axetil]     Double vision and joint pain  . Ciprofloxacin Hcl     Pain in stomach, bleeding.     (Not in a hospital admission)  No results found for this or any previous visit (from the past 48 hour(s)). No results found.  Review of Systems  Constitutional: Negative.   HENT: Negative.   Eyes: Negative.   Respiratory: Negative.    Cardiovascular: Negative.   Gastrointestinal: Negative.   Genitourinary: Negative.   Musculoskeletal: Positive for joint pain.  Skin: Negative.   Neurological: Negative.   Psychiatric/Behavioral: Negative.     There were no vitals taken for this visit. Physical Exam  Constitutional: She is oriented to person, place, and time. She appears well-developed.  HENT:  Head: Normocephalic.  Eyes: Pupils are equal, round, and reactive to light.  Neck: Normal range of motion.  Cardiovascular: Normal rate.   Respiratory: Effort normal.  GI: Soft.  Musculoskeletal:  On examination of the right knee, tender medial joint line. Patellofemoral pain with compression. Trace effusion. No McMurray. Knee exam on inspection reveals no evidence of soft tissue swelling, ecchymosis, deformity or erythema. Nontender over the fibular head or the peroneal nerve. Nontender over the quadriceps insertion of the patellar ligament insertion. The range of motion was full. Provocative maneuvers revealed a negative Lachman, negative anterior and posterior drawer and a negative McMurray. No instability was noted with varus and valgus stressing at 0 or 30 degrees. On manual motor test the quadriceps and hamstrings were 5/5. Sensory exam was intact to light touch.  Neurological: She is alert and oriented to person, place, and time.  Skin: Skin is warm and dry.    MRI demonstrates displaced fragment of the posterior horn of lateral meniscus, degeneration of the  medial meniscus. Patellofemoral arthrosis.  Assessment/Plan 1. Minimally symptomatic osteoarthrosis of the knee. 2. Asymptomatic meniscus tear.  Given that she is improving and the lack of mechanical symptoms, we discussed observation, activity modification, avoid squatting, walking. We discussed possibility of recurrent symptomatic meniscus requiring arthroscopy and partial meniscectomy. She will call if that recurs, use a hand rail, etc. as it can be unpredictable.  She understands. We will wait to hear from her.  Dr. Tonita Cong discussed surgery itself as well as risks, complications and alternatives including but not limited to DVT, PE, failure of procedure, need for secondary procedure, anesthesia risk, even death. Pt desires to proceed.  Plan right knee arthroscopy, partial medial and lateral meniscectomy, debridement  Maribelle Hopple, Conley Rolls., PA-C for Dr. Tonita Cong 12/22/2015, 8:22 AM

## 2015-12-23 ENCOUNTER — Encounter (HOSPITAL_COMMUNITY)
Admission: RE | Admit: 2015-12-23 | Discharge: 2015-12-23 | Disposition: A | Discharge status: Home / Self Care | Payer: Medicare Other | Source: Ambulatory Visit | Attending: Specialist | Admitting: Specialist

## 2015-12-23 ENCOUNTER — Encounter (HOSPITAL_COMMUNITY): Payer: Self-pay

## 2015-12-23 DIAGNOSIS — M1990 Unspecified osteoarthritis, unspecified site: Secondary | ICD-10-CM | POA: Insufficient documentation

## 2015-12-23 DIAGNOSIS — Z01812 Encounter for preprocedural laboratory examination: Secondary | ICD-10-CM | POA: Diagnosis not present

## 2015-12-23 DIAGNOSIS — S83241A Other tear of medial meniscus, current injury, right knee, initial encounter: Secondary | ICD-10-CM | POA: Diagnosis not present

## 2015-12-23 DIAGNOSIS — X58XXXA Exposure to other specified factors, initial encounter: Secondary | ICD-10-CM | POA: Insufficient documentation

## 2015-12-23 DIAGNOSIS — S83281A Other tear of lateral meniscus, current injury, right knee, initial encounter: Secondary | ICD-10-CM | POA: Insufficient documentation

## 2015-12-23 DIAGNOSIS — Z0183 Encounter for blood typing: Secondary | ICD-10-CM | POA: Insufficient documentation

## 2015-12-23 HISTORY — DX: Asymptomatic varicose veins of unspecified lower extremity: I83.90

## 2015-12-23 LAB — BASIC METABOLIC PANEL
Anion gap: 5 (ref 5–15)
BUN: 20 mg/dL (ref 6–20)
CALCIUM: 10 mg/dL (ref 8.9–10.3)
CO2: 29 mmol/L (ref 22–32)
Chloride: 104 mmol/L (ref 101–111)
Creatinine, Ser: 0.66 mg/dL (ref 0.44–1.00)
GFR calc Af Amer: >60 mL/min (ref >60)
GLUCOSE: 145 mg/dL — AB (ref 65–99)
POTASSIUM: 5.4 mmol/L — AB (ref 3.5–5.1)
SODIUM: 138 mmol/L (ref 135–145)

## 2015-12-23 LAB — CBC
HEMATOCRIT: 39.4 % (ref 36.0–46.0)
Hemoglobin: 13.4 g/dL (ref 12.0–15.0)
MCH: 29 pg (ref 26.0–34.0)
MCHC: 34 g/dL (ref 30.0–36.0)
MCV: 85.3 fL (ref 78.0–100.0)
PLATELETS: 239 10*3/uL (ref 150–400)
RBC: 4.62 MIL/uL (ref 3.87–5.11)
RDW: 12.7 % (ref 11.5–15.5)
WBC: 7.2 10*3/uL (ref 4.0–10.5)

## 2015-12-23 LAB — ABO/RH: ABO/RH(D): A NEG

## 2015-12-23 NOTE — Patient Instructions (Addendum)
PARA ASTI  12/23/2015   Your procedure is scheduled on: 01-02-16  Report to [facility]  Entrance take [facility]  elevators to 3rd floor to  [place] at    1:00 PM.  Call this number if you have problems the morning of surgery [phone]   Remember: ONLY 1 PERSON MAY GO WITH YOU TO SHORT STAY TO GET  READY MORNING OF [name].  Do not eat food or drink liquids :After Midnight. Exception may have Clear Liquids 12 midnight to 0900 AM, then nothing.     Take these medicines the morning of surgery with A SIP OF WATER: Diltiazem. Lansoprazole. DO NOT TAKE ANY DIABETIC MEDICATIONS DAY OF YOUR SURGERY                               You may not have any metal on your body including hair pins and              piercings  Do not wear jewelry, make-up, lotions, powders or perfumes, deodorant             Do not wear nail polish.  Do not shave  48 hours prior to surgery.              Men may shave face and neck.   Do not bring valuables to the hospital. [name].  Contacts, dentures or bridgework may not be worn into surgery.  Leave suitcase in the car. After surgery it may be brought to your room.     Patients discharged the day of surgery will not be allowed to drive home.  Name and phone number of your driver: [name] [phone] cell  Special Instructions: N/A              Please read over the following fact sheets you were given: _____________________________________________________________________             [facility] - Preparing for Surgery Before surgery, you can play an important role.  Because skin is not sterile, your skin needs to be as free of germs as possible.  You can reduce the number of germs on your skin by washing with CHG (chlorahexidine gluconate) soap before surgery.  CHG is an antiseptic cleaner which kills germs and bonds with the skin to continue killing germs even  after washing. Please DO NOT use if you have an allergy to CHG or antibacterial soaps.  If your skin becomes reddened/irritated stop using the CHG and inform your nurse when you arrive at Short Stay. Do not shave (including legs and underarms) for at least 48 hours prior to the first CHG shower.  You may shave your face/neck. Please follow these instructions carefully:  1.  Shower with CHG Soap the night before surgery and the  morning of Surgery.  2.  If you choose to wash your hair, wash your hair first as usual with your  normal  shampoo.  3.  After you shampoo, rinse your hair and body thoroughly to remove the  shampoo.                           4.  Use CHG as  you would any other liquid soap.  You can apply chg directly  to the skin and wash                       Gently with a scrungie or clean washcloth.  5.  Apply the CHG Soap to your body ONLY FROM THE NECK DOWN.   Do not use on face/ open                           Wound or open sores. Avoid contact with eyes, ears mouth and genitals (private parts).                       Wash face,  Genitals (private parts) with your normal soap.             6.  Wash thoroughly, paying special attention to the area where your surgery  will be performed.  7.  Thoroughly rinse your body with warm water from the neck down.  8.  DO NOT shower/wash with your normal soap after using and rinsing off  the CHG Soap.                9.  Pat yourself dry with a clean towel.            10.  Wear clean pajamas.            11.  Place clean sheets on your bed the night of your first shower and do not  sleep with pets. Day of Surgery : Do not apply any lotions/deodorants the morning of surgery.  Please wear clean clothes to the hospital/surgery center.  FAILURE TO FOLLOW THESE INSTRUCTIONS MAY RESULT IN THE CANCELLATION OF YOUR SURGERY PATIENT SIGNATURE_________________________________  NURSE  SIGNATURE__________________________________  ________________________________________________________________________   [name]  An incentive spirometer is a tool that can help keep your lungs clear and active. This tool measures how well you are filling your lungs with each breath. Taking long deep breaths may help reverse or decrease the chance of developing breathing (pulmonary) problems (especially infection) following:  A long period of time when you are unable to move or be active. BEFORE THE PROCEDURE   If the spirometer includes an indicator to show your best effort, your nurse or respiratory therapist will set it to a desired goal.  If possible, sit up straight or lean slightly forward. Try not to slouch.  Hold the incentive spirometer in an upright position. INSTRUCTIONS FOR USE  1. Sit on the edge of your bed if possible, or sit up as far as you can in bed or on a chair. 2. Hold the incentive spirometer in an upright position. 3. Breathe out normally. 4. Place the mouthpiece in your mouth and seal your lips tightly around it. 5. Breathe in slowly and as deeply as possible, raising the piston or the ball toward the top of the column. 6. Hold your breath for 3-5 seconds or for as long as possible. Allow the piston or ball to fall to the bottom of the column. 7. Remove the mouthpiece from your mouth and breathe out normally. 8. Rest for a few seconds and repeat Steps 1 through 7 at least 10 times every 1-2 hours when you are awake. Take your time and take a few normal breaths between deep breaths. 9. The spirometer may include an indicator to show your best effort. Use the  indicator as a goal to work toward during each repetition. 10. After each set of 10 deep breaths, practice coughing to be sure your lungs are clear. If you have an incision (the cut made at the time of surgery), support your incision when coughing by placing a pillow or rolled up towels firmly  against it. Once you are able to get out of bed, walk around indoors and cough well. You may stop using the incentive spirometer when instructed by your caregiver.  RISKS AND COMPLICATIONS  Take your time so you do not get dizzy or light-headed.  If you are in pain, you may need to take or ask for pain medication before doing incentive spirometry. It is harder to take a deep breath if you are having pain. AFTER USE  Rest and breathe slowly and easily.  It can be helpful to keep track of a log of your progress. Your caregiver can provide you with a simple table to help with this. If you are using the spirometer at home, follow these instructions: Will IF:   You are having difficultly using the spirometer.  You have trouble using the spirometer as often as instructed.  Your pain medication is not giving enough relief while using the spirometer.  You develop fever of 100.5 F (38.1 C) or higher. SEEK IMMEDIATE MEDICAL CARE IF:   You cough up bloody sputum that had not been present before.  You develop fever of 102 F (38.9 C) or greater.  You develop worsening pain at or near the incision site. MAKE SURE YOU:   Understand these instructions.  Will watch your condition.  Will get help right away if you are not doing well or get worse. Document Released: 10/25/2006 Document Revised: 09/06/2011 Document Reviewed: 12/26/2006 [facility] Patient Information 2014 [place], [place].   ________________________________________________________________________

## 2015-12-23 NOTE — Pre-Procedure Instructions (Signed)
EKG 8'16 Epic

## 2016-01-01 ENCOUNTER — Other Ambulatory Visit: Payer: Self-pay | Admitting: *Deleted

## 2016-01-01 MED ORDER — LISINOPRIL-HYDROCHLOROTHIAZIDE 20-12.5 MG PO TABS: 0.5000 | ORAL_TABLET | Freq: Two times a day (BID) | ORAL | Status: DC

## 2016-01-02 ENCOUNTER — Encounter (HOSPITAL_COMMUNITY)
Admission: RE | Disposition: A | Discharge status: Home / Self Care | Payer: Self-pay | Source: Ambulatory Visit | Attending: Specialist

## 2016-01-02 ENCOUNTER — Ambulatory Visit (HOSPITAL_COMMUNITY): Payer: Medicare Other | Admitting: Anesthesiology

## 2016-01-02 ENCOUNTER — Ambulatory Visit (HOSPITAL_COMMUNITY)
Admission: RE | Admit: 2016-01-02 | Discharge: 2016-01-02 | Disposition: A | Discharge status: Home / Self Care | Payer: Medicare Other | Source: Ambulatory Visit | Attending: Specialist | Admitting: Specialist

## 2016-01-02 ENCOUNTER — Encounter (HOSPITAL_COMMUNITY): Payer: Self-pay | Admitting: *Deleted

## 2016-01-02 DIAGNOSIS — Z87891 Personal history of nicotine dependence: Secondary | ICD-10-CM | POA: Insufficient documentation

## 2016-01-02 DIAGNOSIS — S83281A Other tear of lateral meniscus, current injury, right knee, initial encounter: Secondary | ICD-10-CM | POA: Diagnosis not present

## 2016-01-02 DIAGNOSIS — M1711 Unilateral primary osteoarthritis, right knee: Secondary | ICD-10-CM | POA: Diagnosis not present

## 2016-01-02 DIAGNOSIS — X58XXXA Exposure to other specified factors, initial encounter: Secondary | ICD-10-CM | POA: Insufficient documentation

## 2016-01-02 DIAGNOSIS — M23300 Other meniscus derangements, unspecified lateral meniscus, right knee: Secondary | ICD-10-CM | POA: Diagnosis not present

## 2016-01-02 DIAGNOSIS — S83241D Other tear of medial meniscus, current injury, right knee, subsequent encounter: Secondary | ICD-10-CM | POA: Diagnosis not present

## 2016-01-02 DIAGNOSIS — I739 Peripheral vascular disease, unspecified: Secondary | ICD-10-CM | POA: Insufficient documentation

## 2016-01-02 DIAGNOSIS — M179 Osteoarthritis of knee, unspecified: Secondary | ICD-10-CM | POA: Diagnosis not present

## 2016-01-02 DIAGNOSIS — I10 Essential (primary) hypertension: Secondary | ICD-10-CM | POA: Insufficient documentation

## 2016-01-02 DIAGNOSIS — S83241A Other tear of medial meniscus, current injury, right knee, initial encounter: Secondary | ICD-10-CM | POA: Diagnosis not present

## 2016-01-02 DIAGNOSIS — M25561 Pain in right knee: Secondary | ICD-10-CM | POA: Diagnosis present

## 2016-01-02 DIAGNOSIS — M23303 Other meniscus derangements, unspecified medial meniscus, right knee: Secondary | ICD-10-CM | POA: Diagnosis not present

## 2016-01-02 DIAGNOSIS — I2510 Atherosclerotic heart disease of native coronary artery without angina pectoris: Secondary | ICD-10-CM | POA: Insufficient documentation

## 2016-01-02 HISTORY — PX: KNEE ARTHROSCOPY: SHX127

## 2016-01-02 LAB — TYPE AND SCREEN
ABO/RH(D): A NEG
Antibody Screen: NEGATIVE

## 2016-01-02 SURGERY — ARTHROSCOPY, KNEE
Anesthesia: General | Site: Knee | Laterality: Right

## 2016-01-02 MED ORDER — ACETAMINOPHEN 10 MG/ML IV SOLN
1000.0000 mg | Freq: Once | INTRAVENOUS | Status: AC
  Administered 2016-01-02: 1000 mg via INTRAVENOUS

## 2016-01-02 MED ORDER — GLYCOPYRROLATE 0.2 MG/ML IJ SOLN
INTRAMUSCULAR | Status: DC | PRN
  Administered 2016-01-02: 0.2 mg via INTRAVENOUS

## 2016-01-02 MED ORDER — OXYCODONE HCL 5 MG PO TABS: 5.0000 mg | ORAL_TABLET | Freq: Once | ORAL | Status: DC | PRN

## 2016-01-02 MED ORDER — FENTANYL CITRATE (PF) 250 MCG/5ML IJ SOLN
INTRAMUSCULAR | Status: DC | PRN
  Administered 2016-01-02: 50 ug via INTRAVENOUS
  Administered 2016-01-02: 25 ug via INTRAVENOUS
  Administered 2016-01-02: 50 ug via INTRAVENOUS
  Administered 2016-01-02: 25 ug via INTRAVENOUS
  Administered 2016-01-02: 50 ug via INTRAVENOUS

## 2016-01-02 MED ORDER — FENTANYL CITRATE (PF) 250 MCG/5ML IJ SOLN
INTRAMUSCULAR | Status: AC
  Filled 2016-01-02: qty 5

## 2016-01-02 MED ORDER — CLINDAMYCIN PHOSPHATE 900 MG/50ML IV SOLN
900.0000 mg | INTRAVENOUS | Status: AC
  Administered 2016-01-02: 900 mg via INTRAVENOUS

## 2016-01-02 MED ORDER — ACETAMINOPHEN 10 MG/ML IV SOLN
INTRAVENOUS | Status: AC
  Filled 2016-01-02: qty 100

## 2016-01-02 MED ORDER — 0.9 % SODIUM CHLORIDE (POUR BTL) OPTIME
TOPICAL | Status: DC | PRN
  Administered 2016-01-02: 1000 mL

## 2016-01-02 MED ORDER — BUPIVACAINE-EPINEPHRINE 0.5% -1:200000 IJ SOLN
INTRAMUSCULAR | Status: DC | PRN
  Administered 2016-01-02: 16 mL

## 2016-01-02 MED ORDER — ESMOLOL HCL 100 MG/10ML IV SOLN
INTRAVENOUS | Status: DC | PRN
  Administered 2016-01-02: 20 mg via INTRAVENOUS

## 2016-01-02 MED ORDER — ONDANSETRON HCL 4 MG/2ML IJ SOLN
INTRAMUSCULAR | Status: DC | PRN
  Administered 2016-01-02: 4 mg via INTRAVENOUS

## 2016-01-02 MED ORDER — TRAMADOL HCL 50 MG PO TABS: 50.0000 mg | ORAL_TABLET | Freq: Four times a day (QID) | ORAL | Status: DC | PRN

## 2016-01-02 MED ORDER — PROPOFOL 10 MG/ML IV BOLUS
INTRAVENOUS | Status: DC | PRN
  Administered 2016-01-02: 150 mg via INTRAVENOUS
  Administered 2016-01-02: 50 mg via INTRAVENOUS

## 2016-01-02 MED ORDER — LIDOCAINE HCL (CARDIAC) 20 MG/ML IV SOLN
INTRAVENOUS | Status: DC | PRN
  Administered 2016-01-02: 100 mg via INTRAVENOUS

## 2016-01-02 MED ORDER — PHENYLEPHRINE HCL 10 MG/ML IJ SOLN
INTRAMUSCULAR | Status: DC | PRN
  Administered 2016-01-02: 80 ug via INTRAVENOUS

## 2016-01-02 MED ORDER — PROPOFOL 10 MG/ML IV BOLUS
INTRAVENOUS | Status: AC
  Filled 2016-01-02: qty 20

## 2016-01-02 MED ORDER — OXYCODONE HCL 5 MG/5ML PO SOLN
5.0000 mg | Freq: Once | ORAL | Status: DC | PRN
  Filled 2016-01-02: qty 5

## 2016-01-02 MED ORDER — FENTANYL CITRATE (PF) 100 MCG/2ML IJ SOLN: 25.0000 ug | INTRAMUSCULAR | Status: DC | PRN

## 2016-01-02 MED ORDER — LIP MEDEX EX OINT
TOPICAL_OINTMENT | CUTANEOUS | Status: AC
  Filled 2016-01-02: qty 7

## 2016-01-02 MED ORDER — LACTATED RINGERS IV SOLN
INTRAVENOUS | Status: DC
  Administered 2016-01-02: 15:00:00 via INTRAVENOUS
  Administered 2016-01-02: 1000 mL via INTRAVENOUS

## 2016-01-02 MED ORDER — SUCCINYLCHOLINE CHLORIDE 20 MG/ML IJ SOLN
INTRAMUSCULAR | Status: DC | PRN
  Administered 2016-01-02: 80 mg via INTRAVENOUS

## 2016-01-02 MED ORDER — ONDANSETRON HCL 4 MG/2ML IJ SOLN
INTRAMUSCULAR | Status: AC
  Filled 2016-01-02: qty 2

## 2016-01-02 MED ORDER — MIDAZOLAM HCL 2 MG/2ML IJ SOLN
INTRAMUSCULAR | Status: AC
  Filled 2016-01-02: qty 2

## 2016-01-02 MED ORDER — BUPIVACAINE-EPINEPHRINE (PF) 0.5% -1:200000 IJ SOLN
INTRAMUSCULAR | Status: AC
  Filled 2016-01-02: qty 30

## 2016-01-02 MED ORDER — ROCURONIUM BROMIDE 100 MG/10ML IV SOLN
INTRAVENOUS | Status: AC
  Filled 2016-01-02: qty 1

## 2016-01-02 MED ORDER — LACTATED RINGERS IR SOLN
Status: DC | PRN
  Administered 2016-01-02: 6000 mL

## 2016-01-02 MED ORDER — PROMETHAZINE HCL 25 MG/ML IJ SOLN: 6.2500 mg | INTRAMUSCULAR | Status: DC | PRN

## 2016-01-02 MED ORDER — ESMOLOL HCL 100 MG/10ML IV SOLN
INTRAVENOUS | Status: AC
  Filled 2016-01-02: qty 10

## 2016-01-02 MED ORDER — CLINDAMYCIN PHOSPHATE 900 MG/50ML IV SOLN
INTRAVENOUS | Status: AC
  Filled 2016-01-02: qty 50

## 2016-01-02 SURGICAL SUPPLY — 26 items
BANDAGE ACE 6X5 VEL STRL LF (GAUZE/BANDAGES/DRESSINGS) ×2 IMPLANT
BLADE 4.2CUDA (BLADE) IMPLANT
BLADE CUDA SHAVER 3.5 (BLADE) ×2 IMPLANT
BOOTIES KNEE HIGH SLOAN (MISCELLANEOUS) IMPLANT
CLOTH 2% CHLOROHEXIDINE 3PK (PERSONAL CARE ITEMS) ×2 IMPLANT
DRSG EMULSION OIL 3X3 NADH (GAUZE/BANDAGES/DRESSINGS) IMPLANT
DRSG PAD ABDOMINAL 8X10 ST (GAUZE/BANDAGES/DRESSINGS) IMPLANT
DURAPREP 26ML APPLICATOR (WOUND CARE) ×2 IMPLANT
GAUZE SPONGE 4X4 12PLY STRL (GAUZE/BANDAGES/DRESSINGS) ×2 IMPLANT
GLOVE BIOGEL PI IND STRL 7.0 (GLOVE) IMPLANT
GLOVE BIOGEL PI INDICATOR 7.0 (GLOVE)
GLOVE SURG SS PI 7.0 STRL IVOR (GLOVE) ×2 IMPLANT
GLOVE SURG SS PI 7.5 STRL IVOR (GLOVE) ×4 IMPLANT
GLOVE SURG SS PI 8.0 STRL IVOR (GLOVE) ×2 IMPLANT
GOWN STRL REUS W/TWL XL LVL3 (GOWN DISPOSABLE) ×4 IMPLANT
KIT BASIN OR (CUSTOM PROCEDURE TRAY) ×2 IMPLANT
MANIFOLD NEPTUNE II (INSTRUMENTS) ×2 IMPLANT
PACK ARTHROSCOPY WL (CUSTOM PROCEDURE TRAY) ×2 IMPLANT
PAD ABD 8X10 STRL (GAUZE/BANDAGES/DRESSINGS) ×2 IMPLANT
PADDING CAST COTTON 6X4 STRL (CAST SUPPLIES) ×2 IMPLANT
SUT ETHILON 4 0 PS 2 18 (SUTURE) ×2 IMPLANT
TOWEL OR 17X26 10 PK STRL BLUE (TOWEL DISPOSABLE) ×2 IMPLANT
TUBING ARTHRO INFLOW-ONLY STRL (TUBING) ×2 IMPLANT
WAND HAND CNTRL MULTIVAC 50 (MISCELLANEOUS) IMPLANT
WAND HAND CNTRL MULTIVAC 90 (MISCELLANEOUS) IMPLANT
WRAP KNEE MAXI GEL POST OP (GAUZE/BANDAGES/DRESSINGS) ×2 IMPLANT

## 2016-01-02 NOTE — Brief Op Note (Signed)
01/02/2016  4:52 PM  PATIENT:  [name]  80 y.o. female  PRE-OPERATIVE DIAGNOSIS:  RIGHT KNEE LATERAL AND MEDIAL MENISCUS TEARS AND DJD  POST-OPERATIVE DIAGNOSIS:  RIGHT KNEE LATERAL AND MEDIAL MENISCUS TEARS AND DJD  PROCEDURE:  Procedure(s): RIGHT ARTHROSCOPY KNEE PARTIAL MEDIAL AND LATERAL MENISCECTOMY AND DEBRIDEMENT (Right)  SURGEON:  Surgeon(s) and Role:    * Susa Day, MD - Primary  PHYSICIAN ASSISTANT:   ASSISTANTS: none   ANESTHESIA:   general  EBL:     BLOOD ADMINISTERED:none  DRAINS: none   LOCAL MEDICATIONS USED:  MARCAINE     SPECIMEN:  No Specimen  DISPOSITION OF SPECIMEN:  N/A  COUNTS:  YES  TOURNIQUET:  * No tourniquets in log *  DICTATION: .Other Dictation: Dictation Number [id number]  PLAN OF CARE: Discharge to home after PACU  PATIENT DISPOSITION:  PACU - hemodynamically stable.   Delay start of Pharmacological VTE agent (>24hrs) due to surgical blood loss or risk of bleeding: no

## 2016-01-02 NOTE — Discharge Instructions (Signed)
ARTHROSCOPIC KNEE SURGERY HOME CARE INSTRUCTIONS ° ° °PAIN °You will be expected to have a moderate amount of pain in the affected knee for approximately two weeks.  However, the first two to four days will be the most severe in terms of the pain you will experience.  Prescriptions have been provided for you to take as needed for the pain.  The pain can be markedly reduced by using the ice/compressive bandage given.  Exchange the ice packs whenever they thaw.  During the night, keep the bandage on because it will still provide some compression for the swelling.  Also, keep the leg elevated on pillows above your heart, and this will help alleviate the pain and swelling. ° °MEDICATION °Prescriptions have been provided to take as needed for pain. To prevent blood clots, take Aspirin 325mg daily with a meal if not on a blood thinner and if no history of stomach ulcers. ° °ACTIVITY °It is preferred that you stay on bedrest for approximately 24 hours.  However, you may go to the bathroom with help.  After this, you can start to be up and about progressively more.  Remember that the swelling may still increase after three to four days if you are up and doing too much.  You may put as much weight on the affected leg as pain will allow.  Use your crutches for comfort and safety.  However, as soon as you are able, you may discard the crutches and go without them.  ° °DRESSING °Keep the current dressing as dry as possible.  Two days after your surgery, you may remove the ice/compressive wrap, and surgical dressing.  You may now take a shower, but do not scrub the sounds directly with soap.  Let water rinse over these and gently wipe with your hand.  Reapply band-aids over the puncture wounds and more gauze if needed.  A slight amount of thin drainage can be normal at this time, and do not let it frighten you.  Reapply the ice/compressive wrap.  You may now repeat this every day each time you shower. ° °SYMPTOMS TO REPORT TO  YOUR DOCTOR ° -Extreme pain. ° -Extreme swelling. ° -Temperature above 101 degrees that does not come down with acetaminophen     (Tylenol). ° -Any changes in the feeling, color or movement of your toes. ° -Extreme redness, heat, swelling or drainage at your incision ° °EXERCISE °It is preferred that you begin to exercise on the day of your surgery.  Straight leg raises and short arc quads should be begun the afternoon or evening of surgery and continued until you come back for your follow-up appointment.   Attached is an instruction sheet on how to perform these two simple exercises.  Do these at least three times per day if not more.  You may bend your knee as much as is comfortable.  The puncture wounds may occasionally be slightly uncomfortable with bending of the knee.  Do not let this frighten you.  It is important to keep your knee motion, but do not overdo it.  If you have significant pain, simply do not bend the knee as far.   You will be given more exercises to perform at your first return visit.   ° °RETURN APPOINTMENT °Please make an appointment to be seen by your doctor in 14 days from your surgery. ° °Patient Signature:  ________________________________________________________ ° °Nurse's Signature:  ________________________________________________________ ° ° °General Anesthesia, Adult, Care After °Refer to this sheet in   the next few weeks. These instructions provide you with information on caring for yourself after your procedure. Your health care provider may also give you more specific instructions. Your treatment has been planned according to current medical practices, but problems sometimes occur. Call your health care provider if you have any problems or questions after your procedure. °WHAT TO EXPECT AFTER THE PROCEDURE °After the procedure, it is typical to experience: °· Sleepiness. °· Nausea and vomiting. °HOME CARE INSTRUCTIONS °· For the first 24 hours after general anesthesia: °¨ Have a  responsible person with you. °¨ Do not drive a car. If you are alone, do not take public transportation. °¨ Do not drink alcohol. °¨ Do not take medicine that has not been prescribed by your health care provider. °¨ Do not sign important papers or make important decisions. °¨ You may resume a normal diet and activities as directed by your health care provider. °· Change bandages (dressings) as directed. °· If you have questions or problems that seem related to general anesthesia, call the hospital and ask for the anesthetist or anesthesiologist on call. °SEEK MEDICAL CARE IF: °· You have nausea and vomiting that continue the day after anesthesia. °· You develop a rash. °SEEK IMMEDIATE MEDICAL CARE IF:  °· You have difficulty breathing. °· You have chest pain. °· You have any allergic problems. °  °This information is not intended to replace advice given to you by your health care provider. Make sure you discuss any questions you have with your health care provider. °  °Document Released: 09/20/2000 Document Revised: 07/05/2014 Document Reviewed: 10/13/2011 °Elsevier Interactive Patient Education ©2016 [employer] ° °

## 2016-01-02 NOTE — H&P (View-Only) (Signed)
[name] is an 80 y.o. female.   Chief Complaint: Right knee pain HPI: The patient is a 80 year old female who presents today for follow up of their knee. The patient is being followed for their right knee pain. They are now month(s) out from when symptoms began. Symptoms reported today include: pain (discomfort) and locking. and report their pain level to be mild to moderate. Current treatment includes: NSAIDs (OTC). The following medication has been used for pain control: antiinflammatory medication (prn). The patient presents today following MRI.  Past Medical History  Diagnosis Date  . Hypertension   . Hypercholesteremia   . Colon polyp   . Occlusion and stenosis of carotid artery without mention of cerebral infarction   . CAD (coronary artery disease)   . Chest pain   . GERD (gastroesophageal reflux disease)   . IBS (irritable bowel syndrome)   . Paresthesia   . Neck pain   . Palpitation   . RBBB   . Situational depression   . DJD (degenerative joint disease)   . PVD (peripheral vascular disease) [facility])     Past Surgical History  Procedure Laterality Date  . Breast biopsy    . Tonsillectomy    . Tubal ligation Bilateral   . Laparoscopic cholecystectomy    . Appendectomy    . Rotator cuff repair      Family History  Problem Relation Age of Onset  . Heart attack Father   . Kidney disease Father    Social History:  reports that she has quit smoking. She has never used smokeless tobacco. She reports that she does not drink alcohol or use illicit drugs.  Allergies:  Allergies  Allergen Reactions  . Ceftin [Cefuroxime Axetil]     Double vision and joint pain  . Ciprofloxacin Hcl     Pain in stomach, bleeding.     (Not in a hospital admission)  No results found for this or any previous visit (from the past 48 hour(s)). No results found.  Review of Systems  Constitutional: Negative.   HENT: Negative.   Eyes: Negative.   Respiratory: Negative.    Cardiovascular: Negative.   Gastrointestinal: Negative.   Genitourinary: Negative.   Musculoskeletal: Positive for joint pain.  Skin: Negative.   Neurological: Negative.   Psychiatric/Behavioral: Negative.     There were no vitals taken for this visit. Physical Exam  Constitutional: She is oriented to person, place, and time. She appears well-developed.  HENT:  Head: Normocephalic.  Eyes: Pupils are equal, round, and reactive to light.  Neck: Normal range of motion.  Cardiovascular: Normal rate.   Respiratory: Effort normal.  GI: Soft.  Musculoskeletal:  On examination of the right knee, tender medial joint line. Patellofemoral pain with compression. Trace effusion. No McMurray. Knee exam on inspection reveals no evidence of soft tissue swelling, ecchymosis, deformity or erythema. Nontender over the fibular head or the peroneal nerve. Nontender over the quadriceps insertion of the patellar ligament insertion. The range of motion was full. Provocative maneuvers revealed a negative Lachman, negative anterior and posterior drawer and a negative McMurray. No instability was noted with varus and valgus stressing at 0 or 30 degrees. On manual motor test the quadriceps and hamstrings were 5/5. Sensory exam was intact to light touch.  Neurological: She is alert and oriented to person, place, and time.  Skin: Skin is warm and dry.    MRI demonstrates displaced fragment of the posterior horn of lateral meniscus, degeneration of the  medial meniscus. Patellofemoral arthrosis.  Assessment/Plan 1. Minimally symptomatic osteoarthrosis of the knee. 2. Asymptomatic meniscus tear.  Given that she is improving and the lack of mechanical symptoms, we discussed observation, activity modification, avoid squatting, walking. We discussed possibility of recurrent symptomatic meniscus requiring arthroscopy and partial meniscectomy. She will call if that recurs, use a hand rail, etc. as it can be unpredictable.  She understands. We will wait to hear from her.  Dr. Tonita Cong discussed surgery itself as well as risks, complications and alternatives including but not limited to DVT, PE, failure of procedure, need for secondary procedure, anesthesia risk, even death. Pt desires to proceed.  Plan right knee arthroscopy, partial medial and lateral meniscectomy, debridement  Ender Rorke, Conley Rolls., PA-C for Dr. Tonita Cong 12/22/2015, 8:22 AM

## 2016-01-02 NOTE — Anesthesia Postprocedure Evaluation (Signed)
Anesthesia Post Note  Patient: SHIFFON DUNLOP  Procedure(s) Performed: Procedure(s) (LRB): RIGHT ARTHROSCOPY KNEE PARTIAL MEDIAL AND LATERAL MENISCECTOMY AND DEBRIDEMENT (Right)  Patient location during evaluation: PACU Anesthesia Type: General Level of consciousness: awake and alert Pain management: pain level controlled Vital Signs Assessment: post-procedure vital signs reviewed and stable Respiratory status: spontaneous breathing, nonlabored ventilation, respiratory function stable and patient connected to nasal cannula oxygen Cardiovascular status: blood pressure returned to baseline and stable Postop Assessment: no signs of nausea or vomiting Anesthetic complications: no    Last Vitals:  Filed Vitals:   01/02/16 1727 01/02/16 1735  BP: 124/78 121/85  Pulse: 84 76  Temp: 36.4 C 36.4 C  Resp: 12 14    Last Pain:  Filed Vitals:   01/02/16 1743  PainSc: 0-No pain                 Zenaida Deed

## 2016-01-02 NOTE — Anesthesia Procedure Notes (Signed)
Procedure Name: Intubation Date/Time: 01/02/2016 4:08 PM Performed by: Maxwell Caul Pre-anesthesia Checklist: Patient identified, Emergency Drugs available, Suction available and Patient being monitored Patient Re-evaluated:Patient Re-evaluated prior to inductionOxygen Delivery Method: Circle system utilized Preoxygenation: Pre-oxygenation with 100% oxygen Intubation Type: IV induction Ventilation: Mask ventilation without difficulty Laryngoscope Size: Mac and 4 Grade View: Grade II Tube type: Oral Tube size: 7.0 mm Number of attempts: 1 Airway Equipment and Method: Stylet Placement Confirmation: ETT inserted through vocal cords under direct vision,  positive ETCO2 and breath sounds checked- equal and bilateral Secured at: 21 cm Tube secured with: Tape Dental Injury: Teeth and Oropharynx as per pre-operative assessment  Comments: LMA #4 gently placed-poor seating. LMA removed and patient intubated with MAC 4 Grade 2 view.

## 2016-01-02 NOTE — Progress Notes (Signed)
Called Dr Tonita Cong and obtained order for rolling walker for patient post op.

## 2016-01-02 NOTE — Anesthesia Preprocedure Evaluation (Addendum)
Anesthesia Evaluation  Patient identified by MRN, date of birth, ID band Patient awake    Reviewed: Allergy & Precautions, H&P , NPO status , Patient's Chart, lab work & pertinent test results  History of Anesthesia Complications Negative for: history of anesthetic complications  Airway Mallampati: II  TM Distance: >3 FB Neck ROM: full    Dental  (+) Upper Dentures, Partial Lower   Pulmonary former smoker,    Pulmonary exam normal breath sounds clear to auscultation       Cardiovascular hypertension, + Peripheral Vascular Disease  Normal cardiovascular exam+ dysrhythmias  Rhythm:regular Rate:Normal  Mild 1-39% right carotid artery disease  Follows with cardiology regularly, takes dilt for paroxysmal atrial tacc, well controlled, no concern for CAD   Neuro/Psych PSYCHIATRIC DISORDERS Depression negative neurological ROS     GI/Hepatic Neg liver ROS, GERD  ,  Endo/Other  negative endocrine ROS  Renal/GU negative Renal ROS     Musculoskeletal  (+) Arthritis ,   Abdominal   Peds  Hematology negative hematology ROS (+)   Anesthesia Other Findings   Reproductive/Obstetrics negative OB ROS                           Anesthesia Physical Anesthesia Plan  ASA: III  Anesthesia Plan: General   Post-op Pain Management:    Induction: Intravenous  Airway Management Planned: LMA  Additional Equipment:   Intra-op Plan:   Post-operative Plan: Extubation in OR  Informed Consent: I have reviewed the patients History and Physical, chart, labs and discussed the procedure including the risks, benefits and alternatives for the proposed anesthesia with the patient or authorized representative who has indicated his/her understanding and acceptance.   Dental Advisory Given  Plan Discussed with: Anesthesiologist, CRNA and Surgeon  Anesthesia Plan Comments:         Anesthesia Quick  Evaluation

## 2016-01-02 NOTE — Transfer of Care (Signed)
Immediate Anesthesia Transfer of Care Note  Patient: [name]  Procedure(s) Performed: Procedure(s): RIGHT ARTHROSCOPY KNEE PARTIAL MEDIAL AND LATERAL MENISCECTOMY AND DEBRIDEMENT (Right)  Patient Location: PACU  Anesthesia Type:General  Level of Consciousness:  sedated, patient cooperative and responds to stimulation  Airway & Oxygen Therapy:Patient Spontanous Breathing and Patient connected to face mask oxgen  Post-op Assessment:  Report given to PACU RN and Post -op Vital signs reviewed and stable  Post vital signs:  Reviewed and stable  Last Vitals:  Filed Vitals:   01/02/16 1255  BP: 160/67  Pulse: 58  Temp: 36.8 C  Resp: 16    Complications: No apparent anesthesia complications

## 2016-01-02 NOTE — Interval H&P Note (Signed)
History and Physical Interval Note:  01/02/2016 12:53 PM  [name]  has presented today for surgery, with the diagnosis of RIGHT KNEE LATERAL AND MEDIAL MENISCUS TEARS AND DJD  The various methods of treatment have been discussed with the patient and family. After consideration of risks, benefits and other options for treatment, the patient has consented to  Procedure(s): RIGHT ARTHROSCOPY KNEE PARTIAL MEDIAL AND LATERAL MENISCECTOMY AND DEBRIDEMENT (Right) as a surgical intervention .  The patient's history has been reviewed, patient examined, no change in status, stable for surgery.  I have reviewed the patient's chart and labs.  Questions were answered to the patient's satisfaction.     Verle Wheeling C

## 2016-01-03 NOTE — Op Note (Signed)
NAMEJAVIANA, [name]               ACCOUNT NO.:  [id number]  MEDICAL RECORD NO.:  [id number]  LOCATION:  [facility]                         FACILITY:  [facility]  PHYSICIAN:  Susa Day, M.D.    DATE OF BIRTH:  [birth year]  DATE OF PROCEDURE:  01/02/2016 DATE OF DISCHARGE:  01/02/2016                              OPERATIVE REPORT   PREOPERATIVE DIAGNOSIS:  Degenerative joint disease, medial and lateral meniscus tear, right knee.  POSTOPERATIVE DIAGNOSIS:  Degenerative joint disease, medial and lateral meniscus tear, right knee.  PROCEDURES PERFORMED: 1. Right knee arthroscopy. 2. Partial medial and lateral meniscectomy. 3. Chondroplasty, medial femoral condyle, medial tibial plateau,     patella, lateral femoral condyle.  DESCRIPTION OF PROCEDURE:  Lateral parapatellar portal was fashioned with a #11 blade.  Ingress cannula atraumatically placed.  Irrigant was utilized to insufflate the joint.  Under direct visualization, a medial parapatellar portal was fashioned with a #11 blade after localization with 18-gauge needle sparing the medial meniscus.  Note, extensive degeneration of the medial compartment and grade 3 change, near grade 4, tearing of the meniscus.  Introduced a shaver and light chondroplasty of the femoral condyle, tibial plateau.  Standard tearing of the meniscus, which shaved it to a stable base.  Remnants of the meniscus, stable to probe palpation.  ACL was unremarkable.  Lateral compartment revealed extensive tearing of the lateral meniscus, introduced a basket, resected to a stable base.  Further contoured with an ArthroWand shaver.  Light chondroplasty performed here.  Suprapatellar pouch, normal patellofemoral tracking.  Grade 3 changes of the patella.  Light chondroplasty performed here.  Revisited all compartments, no further pathology amenable to arthroscopic intervention.  I therefore removed all instrumentation.  Portals were closed with 4-0 nylon simple  suture.  0.25% Marcaine with epinephrine infiltrated in the joint.  Wound was dressed sterilely, woke without difficulty and transported to the recovery room in satisfactory condition.  The patient tolerated the procedure well.  No complications.  Minimal blood loss.     Susa Day, M.D.     [name]  D:  01/02/2016  T:  01/03/2016  Job:  [id number]

## 2016-01-05 ENCOUNTER — Encounter (HOSPITAL_COMMUNITY): Payer: Self-pay | Admitting: Specialist

## 2016-01-10 DIAGNOSIS — J069 Acute upper respiratory infection, unspecified: Secondary | ICD-10-CM | POA: Diagnosis not present

## 2016-01-13 DIAGNOSIS — H6693 Otitis media, unspecified, bilateral: Secondary | ICD-10-CM | POA: Diagnosis not present

## 2016-01-16 DIAGNOSIS — Z4789 Encounter for other orthopedic aftercare: Secondary | ICD-10-CM | POA: Diagnosis not present

## 2016-01-20 DIAGNOSIS — H6523 Chronic serous otitis media, bilateral: Secondary | ICD-10-CM | POA: Diagnosis not present

## 2016-01-20 DIAGNOSIS — J310 Chronic rhinitis: Secondary | ICD-10-CM | POA: Diagnosis not present

## 2016-01-30 DIAGNOSIS — F419 Anxiety disorder, unspecified: Secondary | ICD-10-CM | POA: Diagnosis not present

## 2016-02-04 DIAGNOSIS — H6523 Chronic serous otitis media, bilateral: Secondary | ICD-10-CM | POA: Diagnosis not present

## 2016-02-04 DIAGNOSIS — J310 Chronic rhinitis: Secondary | ICD-10-CM | POA: Diagnosis not present

## 2016-02-13 DIAGNOSIS — Z4789 Encounter for other orthopedic aftercare: Secondary | ICD-10-CM | POA: Diagnosis not present

## 2016-02-18 DIAGNOSIS — K5730 Diverticulosis of large intestine without perforation or abscess without bleeding: Secondary | ICD-10-CM | POA: Diagnosis not present

## 2016-02-18 DIAGNOSIS — Z1389 Encounter for screening for other disorder: Secondary | ICD-10-CM | POA: Diagnosis not present

## 2016-02-18 DIAGNOSIS — Z0001 Encounter for general adult medical examination with abnormal findings: Secondary | ICD-10-CM | POA: Diagnosis not present

## 2016-02-18 DIAGNOSIS — K219 Gastro-esophageal reflux disease without esophagitis: Secondary | ICD-10-CM | POA: Diagnosis not present

## 2016-02-18 DIAGNOSIS — M549 Dorsalgia, unspecified: Secondary | ICD-10-CM | POA: Diagnosis not present

## 2016-02-18 DIAGNOSIS — E538 Deficiency of other specified B group vitamins: Secondary | ICD-10-CM | POA: Diagnosis not present

## 2016-02-18 DIAGNOSIS — I10 Essential (primary) hypertension: Secondary | ICD-10-CM | POA: Diagnosis not present

## 2016-02-18 DIAGNOSIS — M25561 Pain in right knee: Secondary | ICD-10-CM | POA: Diagnosis not present

## 2016-02-18 DIAGNOSIS — Z86010 Personal history of colonic polyps: Secondary | ICD-10-CM | POA: Diagnosis not present

## 2016-02-18 DIAGNOSIS — R252 Cramp and spasm: Secondary | ICD-10-CM | POA: Diagnosis not present

## 2016-02-18 DIAGNOSIS — Z79899 Other long term (current) drug therapy: Secondary | ICD-10-CM | POA: Diagnosis not present

## 2016-02-18 DIAGNOSIS — H6693 Otitis media, unspecified, bilateral: Secondary | ICD-10-CM | POA: Diagnosis not present

## 2016-02-18 DIAGNOSIS — E559 Vitamin D deficiency, unspecified: Secondary | ICD-10-CM | POA: Diagnosis not present

## 2016-02-18 DIAGNOSIS — G4700 Insomnia, unspecified: Secondary | ICD-10-CM | POA: Diagnosis not present

## 2016-02-20 DIAGNOSIS — J342 Deviated nasal septum: Secondary | ICD-10-CM | POA: Diagnosis not present

## 2016-02-20 DIAGNOSIS — H90A22 Sensorineural hearing loss, unilateral, left ear, with restricted hearing on the contralateral side: Secondary | ICD-10-CM | POA: Diagnosis not present

## 2016-02-20 DIAGNOSIS — H90A31 Mixed conductive and sensorineural hearing loss, unilateral, right ear with restricted hearing on the contralateral side: Secondary | ICD-10-CM | POA: Diagnosis not present

## 2016-02-20 DIAGNOSIS — J310 Chronic rhinitis: Secondary | ICD-10-CM | POA: Diagnosis not present

## 2016-02-20 DIAGNOSIS — H6993 Unspecified Eustachian tube disorder, bilateral: Secondary | ICD-10-CM | POA: Diagnosis not present

## 2016-02-23 DIAGNOSIS — N390 Urinary tract infection, site not specified: Secondary | ICD-10-CM | POA: Diagnosis not present

## 2016-03-22 DIAGNOSIS — Z01419 Encounter for gynecological examination (general) (routine) without abnormal findings: Secondary | ICD-10-CM | POA: Diagnosis not present

## 2016-03-22 DIAGNOSIS — R351 Nocturia: Secondary | ICD-10-CM | POA: Diagnosis not present

## 2016-03-22 DIAGNOSIS — N390 Urinary tract infection, site not specified: Secondary | ICD-10-CM | POA: Diagnosis not present

## 2016-03-22 DIAGNOSIS — Z6825 Body mass index (BMI) 25.0-25.9, adult: Secondary | ICD-10-CM | POA: Diagnosis not present

## 2016-03-24 DIAGNOSIS — Z9889 Other specified postprocedural states: Secondary | ICD-10-CM | POA: Diagnosis not present

## 2016-03-24 DIAGNOSIS — Z4789 Encounter for other orthopedic aftercare: Secondary | ICD-10-CM | POA: Diagnosis not present

## 2016-03-24 DIAGNOSIS — M1711 Unilateral primary osteoarthritis, right knee: Secondary | ICD-10-CM | POA: Diagnosis not present

## 2016-04-07 DIAGNOSIS — H10411 Chronic giant papillary conjunctivitis, right eye: Secondary | ICD-10-CM | POA: Diagnosis not present

## 2016-04-07 DIAGNOSIS — H2513 Age-related nuclear cataract, bilateral: Secondary | ICD-10-CM | POA: Diagnosis not present

## 2016-04-07 DIAGNOSIS — H1045 Other chronic allergic conjunctivitis: Secondary | ICD-10-CM | POA: Diagnosis not present

## 2016-04-27 DIAGNOSIS — Z23 Encounter for immunization: Secondary | ICD-10-CM | POA: Diagnosis not present

## 2016-06-08 ENCOUNTER — Other Ambulatory Visit: Payer: Self-pay | Admitting: Cardiology

## 2016-06-09 ENCOUNTER — Telehealth: Payer: Self-pay | Admitting: Cardiology

## 2016-06-09 NOTE — Progress Notes (Signed)
Cardiology Office Note    Date:  06/10/2016   ID:  [name], DOB [birth year], MRN [id number]  PCP:  [facility] Screws, MD  Cardiologist:  Dr. Marlou Porch  CC: palpitations and chest pain  History of Present Illness:  [name] is a 80 y.o. female with a history of paroxysmal atrial tachycardia, non obstructive CAD, RBBB, HLD, HTN, carotid artery stenosis and dizziness who presents to clinic for evaluation of palpitations and chest pain.   She has been treated for PAT with many different mediations with various side effects. She had been on atenolol for a while. She felt like her hair was falling out and switched to diltizem last visit in 08/2015. No evidence of significant coronary artery disease on catheterization in 2012. She has continued to have atypical chest pain that Dr. Marlou Porch has provided reassurance about.  Today she presens to clinic for evaluation of palpations and chest pain. Yesterday she was out shopping and felt weak and dizzy and had severe chest pain that went into her neck. She took a SL NTG that helped over 20 minutes. She has a similar episode about 5 years ago at [facility] as well. She also has been having more palpitations recently. She had an ear infection and had to take abx and steroids but she stopped steroids right away because it made her heart flip and BP go up. She has chronic LE edema and has chronic LE pain that she thinks are from the Crestor (takes a quarter pill). No orthopnea or PND. No dizziness or syncope. She hasn't been very active due to knee pain. Doesn't get much exertion.     Past Medical History:  Diagnosis Date  . CAD (coronary artery disease)   . Chest pain   . Colon polyp   . DJD (degenerative joint disease)   . GERD (gastroesophageal reflux disease)   . Hypercholesteremia   . Hypertension   . IBS (irritable bowel syndrome)   . Neck pain   . Occlusion and stenosis of carotid artery without mention of cerebral infarction   .  Palpitation   . Paresthesia   . PVD (peripheral vascular disease) ([place])   . RBBB   . Situational depression   . Varicose veins    bilateral legs    Past Surgical History:  Procedure Laterality Date  . APPENDECTOMY    . BREAST BIOPSY    . KNEE ARTHROSCOPY Right 01/02/2016   Procedure: RIGHT ARTHROSCOPY KNEE PARTIAL MEDIAL AND LATERAL MENISCECTOMY AND DEBRIDEMENT;  Surgeon: Susa Day, MD;  Location: [facility];  Service: Orthopedics;  Laterality: Right;  . LAPAROSCOPIC CHOLECYSTECTOMY     2011   . ROTATOR CUFF REPAIR Right   . TONSILLECTOMY    . TUBAL LIGATION Bilateral     Current Medications: Outpatient Medications Prior to Visit  Medication Sig Dispense Refill  . aspirin 81 MG tablet Take 81 mg by mouth daily.    Marland Kitchen diltiazem (CARDIZEM CD) 120 MG 24 hr capsule TAKE 1 CAPSULE (120 MG TOTAL) BY MOUTH DAILY. 90 capsule 3  . docusate sodium (COLACE) 100 MG capsule Take 100-200 mg by mouth daily as needed for mild constipation or moderate constipation.    . lansoprazole (PREVACID) 15 MG capsule Take 15 mg by mouth every other day.    . lisinopril-hydrochlorothiazide (PRINZIDE,ZESTORETIC) 20-12.5 MG tablet Take 0.5 tablets by mouth 2 (two) times daily. 90 tablet 2  . loratadine (CLARITIN) 10 MG tablet Take 10 mg by mouth daily as needed.     Marland Kitchen  nitroGLYCERIN (NITROSTAT) 0.4 MG SL tablet Place 1 tablet (0.4 mg total) under the tongue every 5 (five) minutes as needed for chest pain. 25 tablet 5  . Omega-3 Fatty Acids (FISH OIL PO) Take 1 capsule by mouth 2 (two) times daily.     . psyllium (METAMUCIL) 58.6 % powder Take 1 packet by mouth daily.    . rosuvastatin (CRESTOR) 5 MG tablet TAKE 1 TABLET BY MOUTH AS DIRECTED 90 tablet 0  . calcium-vitamin D (OSCAL WITH D) 500-200 MG-UNIT tablet Take 1 tablet by mouth daily with breakfast.    . Cholecalciferol (VITAMIN D) 2000 units CAPS Take 1 capsule by mouth daily.    . cholecalciferol (VITAMIN D) 400 units TABS tablet Take 800 Units by mouth  daily.    . traMADol (ULTRAM) 50 MG tablet Take 1 tablet (50 mg total) by mouth every 6 (six) hours as needed. 40 tablet 1   No facility-administered medications prior to visit.      Allergies:   Ceftin [cefuroxime axetil]; Ciprofloxacin hcl; and Nitrofuran derivatives   Social History   Social History  . Marital status: Widowed    Spouse name: N/A  . Number of children: N/A  . Years of education: N/A   Social History Main Topics  . Smoking status: Former Research scientist (life sciences)  . Smokeless tobacco: Never Used     Comment: Quit smoking age 58   . Alcohol use No  . Drug use: No  . Sexual activity: Not Asked   Other Topics Concern  . None   Social History Narrative  . None     Family History:  The patient's family history includes Heart attack in her father; Kidney disease in her father.     ROS:   Please see the history of present illness.    ROS All other systems reviewed and are negative.   PHYSICAL EXAM:   VS:  BP 122/62   Pulse (!) 58   Ht 5\' 4"  (1.626 m)   Wt 150 lb 4 oz (68.2 kg)   BMI 25.79 kg/m    GEN: Well nourished, well developed, in no acute distress  HEENT: normal  Neck: no JVD, carotid bruits, or masses Cardiac: RRR; no murmurs, rubs, or gallops,no edema  Respiratory:  clear to auscultation bilaterally, normal work of breathing GI: soft, nontender, nondistended, + BS MS: no deformity or atrophy  Skin: warm and dry, no rash Neuro:  Alert and Oriented x 3, Strength and sensation are intact Psych: euthymic mood, full affect  Wt Readings from Last 3 Encounters:  06/10/16 150 lb 4 oz (68.2 kg)  01/02/16 154 lb (69.9 kg)  12/23/15 154 lb (69.9 kg)      Studies/Labs Reviewed:   EKG:  EKG is ordered today.  The ekg ordered today demonstrates sinus bradycardia RBBB, HR 58  Recent Labs: 12/23/2015: BUN 20; Creatinine, Ser 0.66; Hemoglobin 13.4; Platelets 239; Potassium 5.4; Sodium 138   Lipid Panel    Component Value Date/Time   CHOL 163 07/24/2013 0913    TRIG 86.0 07/24/2013 0913   HDL 61.80 07/24/2013 0913   CHOLHDL 3 07/24/2013 0913   VLDL 17.2 07/24/2013 0913   LDLCALC 84 07/24/2013 0913    Additional studies/ records that were reviewed today include:  Carotid dopplers: 09/09/2015 Heterogeneous plaque on the right. Normal left carotid arteries. RICA stenosis has decreased from prior exam, now in the 1-39% range Normal subclavian arteries, bilaterally. Patent vertebral arteries with antegrade flow.  Cardiac monitor 6-01/2015 Notes  Recorded by Jerline Pain, MD on 02/03/2015 at 4:28 PM   No adverse rhythms   No atrial fibrillation   Reassuring        Cath 2012 FINDINGS: Left main artery splits into the LAD and circumflex artery. There are 2 diagonal branches, the second of which encompasses quite a significant territory. The circumflex has 3 obtuse marginal branches and the right coronary artery is the dominant vessel giving rise to the posterior descending artery and 2 other posterolateral branches. There is no angiographically significant coronary artery disease present. The vessels are all tortuous likely from longstanding hypertension. Left ventricular ejection fraction is 65-70% with no wall motion abnormality. There is no gradient across the aortic valve. There is no significant mitral regurgitation present. There is no evidence of abdominal aortic aneurysm. Her distal abdominal aorta is small in caliber. Left ventricular systolic pressure 0000000 with an end-diastolic pressure of 16, aortic pressure 151/65 with a mean of 94 mmHg. No significant gradient across the aortic valve.    ASSESSMENT & PLAN:    Palpitations: long history of this with multiple intolerances. Currently on Cardizem CD 120mg  daily.  Felt to have PAT in the past. Will plan for another 30 day event monitor. She doesn't want to wear the whole 30 days. I encouraged her to wear at least 2 weeks and to make sure she is wearing it while  symptoms are occurring.   Chest pain: has a long history of atypical chest pain. She says this is different. Cath in 2012 with non obst CAD.  Plan on [place].   HTN: well controlled currently   Carotid artery stenosis: 1-39% bilaterally on most recent dopplers 08/2015.  HLD: continue statin   Non obst CAD with atypical chest pain: stable. Continue ASA and statin  RBBB: stable    Medication Adjustments/Labs and Tests Ordered: Current medicines are reviewed at length with the patient today.  Concerns regarding medicines are outlined above.  Medication changes, Labs and Tests ordered today are listed in the Patient Instructions below. Patient Instructions  Medication Instructions:  Your physician recommends that you continue on your current medications as directed. Please refer to the Current Medication list given to you today.   Labwork: None ordered  Testing/Procedures: Your physician has requested that you have a lexiscan myoview. For further information please visit [email or web address]. Please follow instruction sheet, as given.  Your physician has recommended that you wear an event monitor. Event monitors are medical devices that record the heart's electrical activity. Doctors most often [place] these monitors to diagnose arrhythmias. Arrhythmias are problems with the speed or rhythm of the heartbeat. The monitor is a small, portable device. You can wear one while you do your normal daily activities. This is usually used to diagnose what is causing palpitations/syncope (passing out).    Follow-Up: Your physician recommends that you schedule a follow-up appointment in: SEE DR. Marlou Porch AS SCHEDULED  Any Other Special Instructions Will Be Listed Below (If Applicable).  Pharmacologic Stress Electrocardiogram A pharmacologic stress electrocardiogram is a heart (cardiac) test that uses nuclear imaging to evaluate the blood supply to your heart. This test may also be called a  pharmacologic stress electrocardiography. Pharmacologic means that a medicine is used to increase your heart rate and blood pressure.  This stress test is done to find areas of poor blood flow to the heart by determining the extent of coronary artery disease (CAD). Some people exercise on a treadmill, which naturally increases the blood flow to  the heart. For those people unable to exercise on a treadmill, a medicine is used. This medicine stimulates your heart and will cause your heart to beat harder and more quickly, as if you were exercising.  Pharmacologic stress tests can help determine:  The adequacy of blood flow to your heart during increased levels of activity in order to clear you for discharge home.  The extent of coronary artery blockage caused by CAD.  Your prognosis if you have suffered a heart attack.  The effectiveness of cardiac procedures done, such as an angioplasty, which can increase the circulation in your coronary arteries.  Causes of chest pain or pressure. LET [facility] CARE PROVIDER KNOW ABOUT:  Any allergies you have.  All medicines you are taking, including vitamins, herbs, eye drops, creams, and over-the-counter medicines.  Previous problems you or members of your family have had with the use of anesthetics.  Any blood disorders you have.  Previous surgeries you have had.  Medical conditions you have.  Possibility of pregnancy, if this applies.  If you are currently breastfeeding. RISKS AND COMPLICATIONS Generally, this is a safe procedure. However, as with any procedure, complications can occur. Possible complications include:  You develop pain or pressure in the following areas:  Chest.  Jaw or neck.  Between your shoulder blades.  Radiating down your left arm.  Headache.  Dizziness or light-headedness.  Shortness of breath.  Increased or irregular heartbeat.  Low blood pressure.  Nausea or vomiting.  Flushing.  Redness going  up the arm and slight pain during injection of medicine.  Heart attack (rare). BEFORE THE PROCEDURE   Avoid all forms of caffeine for 24 hours before your test or as directed by your health care provider. This includes coffee, tea (even decaffeinated tea), caffeinated sodas, chocolate, cocoa, and certain pain medicines.  Follow your health care provider's instructions regarding eating and drinking before the test.  Take your medicines as directed at regular times with water unless instructed otherwise. Exceptions may include:  If you have diabetes, ask how you are to take your insulin or pills. It is common to adjust insulin dosing the morning of the test.  If you are taking beta-blocker medicines, it is important to talk to your health care provider about these medicines well before the date of your test. Taking beta-blocker medicines may interfere with the test. In some cases, these medicines need to be changed or stopped 24 hours or more before the test.  If you wear a nitroglycerin patch, it may need to be removed prior to the test. Ask your health care provider if the patch should be removed before the test.  If you use an inhaler for any breathing condition, bring it with you to the test.  If you are an outpatient, bring a snack so you can eat right after the stress phase of the test.  Do not smoke for 4 hours prior to the test or as directed by your health care provider.  Do not apply lotions, powders, creams, or oils on your chest prior to the test.  Wear comfortable shoes and clothing. Let your health care provider know if you were unable to complete or follow the preparations for your test. PROCEDURE   Multiple patches (electrodes) will be put on your chest. If needed, small areas of your chest may be shaved to get better contact with the electrodes. Once the electrodes are attached to your body, multiple wires will be attached to the electrodes, and  your heart rate will be  monitored.  An IV access will be started. A nuclear trace (isotope) is given. The isotope may be given intravenously, or it may be swallowed. Nuclear refers to several types of radioactive isotopes, and the nuclear isotope lights up the arteries so that the nuclear images are clear. The isotope is absorbed by your body. This results in low radiation exposure.  A resting nuclear image is taken to show how your heart functions at rest.  A medicine is given through the IV access.  A second scan is done about 1 hour after the medicine injection and determines how your heart functions under stress.  During this stress phase, you will be connected to an electrocardiogram machine. Your blood pressure and oxygen levels will be monitored. AFTER THE PROCEDURE   Your heart rate and blood pressure will be monitored after the test.  You may return to your normal schedule, including diet,activities, and medicines, unless your health care provider tells you otherwise. This information is not intended to replace advice given to you by your health care provider. Make sure you discuss any questions you have with your health care provider. Document Released: 10/31/2008 Document Revised: 06/19/2013 Document Reviewed: 02/19/2013 Elsevier Interactive Patient Education  2017 [place].   Cardiac Event Monitoring A cardiac event monitor is a small recording device used to help detect abnormal heart rhythms (arrhythmias). The monitor is used to record heart rhythm when noticeable symptoms such as the following occur:  Fast heartbeats (palpitations), such as heart racing or fluttering.  Dizziness.  Fainting or light-headedness.  Unexplained weakness. The monitor is wired to two electrodes placed on your chest. Electrodes are flat, sticky disks that attach to your skin. The monitor can be worn for up to 30 days. You will wear the monitor at all times, except when bathing. How to use your cardiac event  monitor A technician will prepare your chest for the electrode placement. The technician will show you how to place the electrodes, how to work the monitor, and how to replace the batteries. Take time to practice using the monitor before you leave the office. Make sure you understand how to send the information from the monitor to your health care provider. This requires a telephone with a landline, not a cell phone. You need to:  Wear your monitor at all times, except when you are in water:  Do not get the monitor wet.  Take the monitor off when bathing. Do not swim or use a hot tub with it on.  Keep your skin clean. Do not put body lotion or moisturizer on your chest.  Change the electrodes daily or any time they stop sticking to your skin. You might need to use tape to keep them on.  It is possible that your skin under the electrodes could become irritated. To keep this from happening, try to put the electrodes in slightly different places on your chest. However, they must remain in the area under your left breast and in the upper right section of your chest.  Make sure the monitor is safely clipped to your clothing or in a location close to your body that your health care provider recommends.  Press the button to record when you feel symptoms of heart trouble, such as dizziness, weakness, light-headedness, palpitations, thumping, shortness of breath, unexplained weakness, or a fluttering or racing heart. The monitor is always on and records what happened slightly before you pressed the button, so do not worry  about being too late to get good information.  Keep a diary of your activities, such as walking, doing chores, and taking medicine. It is especially important to note what you were doing when you pushed the button to record your symptoms. This will help your health care provider determine what might be contributing to your symptoms. The information stored in your monitor will be reviewed  by your health care provider alongside your diary entries.  Send the recorded information as recommended by your health care provider. It is important to understand that it will take some time for your health care provider to process the results.  Change the batteries as recommended by your health care provider. Get help right away if:  You have chest pain.  You have extreme difficulty breathing or shortness of breath.  You develop a very fast heartbeat that persists.  You develop dizziness that does not go away.  You faint or constantly feel you are about to faint. This information is not intended to replace advice given to you by your health care provider. Make sure you discuss any questions you have with your health care provider. Document Released: 03/23/2008 Document Revised: 11/20/2015 Document Reviewed: 12/11/2012 Elsevier Interactive Patient Education  2017 [employer].   If you need a refill on your cardiac medications before your next appointment, please call your pharmacy.      Signed, Angelena Form, PA-C  06/10/2016 12:23 PM    [facility] [address], [place], [place]  [address] Phone: [phone]; Fax: [phone]

## 2016-06-09 NOTE — Telephone Encounter (Signed)
I spoke with the pt and she contacted our office this morning and scheduled an appointment for 06/11/16 to discuss her irregular pulse due to it feeling different and occurring more frequently.    The pt called the office now because she was in the store and developed dizziness and a "hard pain" all through her chest and the pt had to sit down because she felt like she would faint.  The pt sat for 10-15 minutes with pain continuing and then remembered she had NTG. The pt took 1 NTG and had relief of symptoms after 5-10 minutes.  The pt thought about calling EMS but instead drove home and called the office. The pt is pain free at this time.  I reviewed chart and pt had cath in 2012 which showed no evidence of significant CAD per Dr Marlou Porch.  I advised the pt that if she has any further pain she should proceed to the ER. I have scheduled the pt to be seen in the office tomorrow for evaluation. Pt agreed with plan.

## 2016-06-09 NOTE — Telephone Encounter (Signed)
New Message:  Pt called saying she felt like she was going to faint and had pain  starting from right under the chest running through her chin and jawbone, pt stated that she was hurting for about 15 mins. She took nitroglycerin pill and after about 5-10 mins it stopped the pain.

## 2016-06-09 NOTE — Progress Notes (Deleted)
Cardiology Office Note    Date:  06/09/2016   ID:  [name], DOB 1935-11-14, MRN [id number]  PCP:  Henrine Screws, MD  Cardiologist:  Dr. Marlou Porch  CC: palpitations and elevated BP  History of Present Illness:  [name] is a 80 y.o. female with a history of paroxysmal atrial tachycardia, non obstructive CAD, RBBB, HLD, HTN, carotid artery stenosis and dizziness who presents to clinic for evaluation of palpitations.   She has been treated for PAT with many different mediations with various side effects. She had been on atenolol for a while. She felt like her hair was falling out and switched to diltizem last visit in 08/2015. No evidence of significant coronary artery disease on catheterization in 2012. She has continued to have atypical chest pain that Dr. Marlou Porch has provided reassurance about.  Today she presens to clinic for evaluation of palpations and elevated BP.   Past Medical History:  Diagnosis Date  . CAD (coronary artery disease)   . Chest pain   . Colon polyp   . DJD (degenerative joint disease)   . GERD (gastroesophageal reflux disease)   . Hypercholesteremia   . Hypertension   . IBS (irritable bowel syndrome)   . Neck pain   . Occlusion and stenosis of carotid artery without mention of cerebral infarction   . Palpitation   . Paresthesia   . PVD (peripheral vascular disease) ([place])   . RBBB   . Situational depression   . Varicose veins    bilateral legs    Past Surgical History:  Procedure Laterality Date  . APPENDECTOMY    . BREAST BIOPSY    . KNEE ARTHROSCOPY Right 01/02/2016   Procedure: RIGHT ARTHROSCOPY KNEE PARTIAL MEDIAL AND LATERAL MENISCECTOMY AND DEBRIDEMENT;  Surgeon: Susa Day, MD;  Location: [facility];  Service: Orthopedics;  Laterality: Right;  . LAPAROSCOPIC CHOLECYSTECTOMY     2011   . ROTATOR CUFF REPAIR Right   . TONSILLECTOMY    . TUBAL LIGATION Bilateral     Current Medications: Outpatient Medications Prior to Visit    Medication Sig Dispense Refill  . aspirin 81 MG tablet Take 81 mg by mouth daily.    . calcium-vitamin D (OSCAL WITH D) 500-200 MG-UNIT tablet Take 1 tablet by mouth daily with breakfast.    . Cholecalciferol (VITAMIN D) 2000 units CAPS Take 1 capsule by mouth daily.    . cholecalciferol (VITAMIN D) 400 units TABS tablet Take 800 Units by mouth daily.    Marland Kitchen diltiazem (CARDIZEM CD) 120 MG 24 hr capsule TAKE 1 CAPSULE (120 MG TOTAL) BY MOUTH DAILY. 90 capsule 3  . docusate sodium (COLACE) 100 MG capsule Take 100-200 mg by mouth daily as needed for mild constipation or moderate constipation.    . lansoprazole (PREVACID) 15 MG capsule Take 15 mg by mouth every other day.    . lisinopril-hydrochlorothiazide (PRINZIDE,ZESTORETIC) 20-12.5 MG tablet Take 0.5 tablets by mouth 2 (two) times daily. 90 tablet 2  . loratadine (CLARITIN) 10 MG tablet Take 10 mg by mouth daily.    . nitroGLYCERIN (NITROSTAT) 0.4 MG SL tablet Place 1 tablet (0.4 mg total) under the tongue every 5 (five) minutes as needed for chest pain. 25 tablet 5  . Omega-3 Fatty Acids (FISH OIL PO) Take 1 capsule by mouth 2 (two) times daily.     . psyllium (METAMUCIL) 58.6 % powder Take 1 packet by mouth daily.    . rosuvastatin (CRESTOR) 5 MG tablet Take  1.25 mg by mouth daily.     . traMADol (ULTRAM) 50 MG tablet Take 1 tablet (50 mg total) by mouth every 6 (six) hours as needed. 40 tablet 1   No facility-administered medications prior to visit.      Allergies:   Ceftin [cefuroxime axetil]; Ciprofloxacin hcl; and Nitrofuran derivatives   Social History   Social History  . Marital status: Widowed    Spouse name: N/A  . Number of children: N/A  . Years of education: N/A   Social History Main Topics  . Smoking status: Former Research scientist (life sciences)  . Smokeless tobacco: Never Used     Comment: Quit smoking age 54   . Alcohol use No  . Drug use: No  . Sexual activity: Not on file   Other Topics Concern  . Not on file   Social History  Narrative  . No narrative on file     Family History:  The patient's ***family history includes Heart attack in her father; Kidney disease in her father.     ROS:   Please see the history of present illness.    ROS All other systems reviewed and are negative.   PHYSICAL EXAM:   VS:  There were no vitals taken for this visit.   GEN: Well nourished, well developed, in no acute distress  HEENT: normal  Neck: no JVD, carotid bruits, or masses Cardiac: ***RRR; no murmurs, rubs, or gallops,no edema  Respiratory:  clear to auscultation bilaterally, normal work of breathing GI: soft, nontender, nondistended, + BS MS: no deformity or atrophy  Skin: warm and dry, no rash Neuro:  Alert and Oriented x 3, Strength and sensation are intact Psych: euthymic mood, full affect  Wt Readings from Last 3 Encounters:  01/02/16 154 lb (69.9 kg)  12/23/15 154 lb (69.9 kg)  09/01/15 148 lb 9.6 oz (67.4 kg)      Studies/Labs Reviewed:   EKG:  EKG is*** ordered today.  The ekg ordered today demonstrates ***  Recent Labs: 12/23/2015: BUN 20; Creatinine, Ser 0.66; Hemoglobin 13.4; Platelets 239; Potassium 5.4; Sodium 138   Lipid Panel    Component Value Date/Time   CHOL 163 07/24/2013 0913   TRIG 86.0 07/24/2013 0913   HDL 61.80 07/24/2013 0913   CHOLHDL 3 07/24/2013 0913   VLDL 17.2 07/24/2013 0913   LDLCALC 84 07/24/2013 0913    Additional studies/ records that were reviewed today include:  Carotid dopplers: 09/09/2015 Heterogeneous plaque on the right. Normal left carotid arteries. RICA stenosis has decreased from prior exam, now in the 1-39% range Normal subclavian arteries, bilaterally. Patent vertebral arteries with antegrade flow.  Cardiac monitor 6-01/2015 Notes Recorded by Jerline Pain, MD on 02/03/2015 at 4:28 PM   No adverse rhythms   No atrial fibrillation   Reassuring        Cath 2012 FINDINGS:  Left main artery splits into the LAD and circumflex artery. There  are 2 diagonal branches, the second of which encompasses quite a significant territory.  The circumflex has 3 obtuse marginal branches and the right coronary artery is the dominant vessel giving rise to the posterior descending artery and 2 other posterolateral branches.  There is no angiographically significant coronary artery disease present.  The vessels are all tortuous likely from longstanding hypertension.  Left ventricular ejection fraction is 65-70% with no wall motion abnormality. There is no gradient across the aortic valve.  There is no significant mitral regurgitation present.  There is no evidence of abdominal  aortic aneurysm.  Her distal abdominal aorta is small in caliber.  Left ventricular systolic pressure 0000000 with an end-diastolic pressure of 16, aortic pressure 151/65 with a mean of 94 mmHg.  No significant gradient across the aortic valve.  ASSESSMENT & PLAN:    Palpitations: long history of this with multiple intolerances. Felt to have PAT in the past.   HTN: reports high Bps  Carotid artery stenosis: 1-39% bilaterally on most recent dopplers 08/2015.  HLD: continue statin   Non obst CAD with atypical chest pain: stable. Continue ASA and statin  RBBB: stable   Medication Adjustments/Labs and Tests Ordered: Current medicines are reviewed at length with the patient today.  Concerns regarding medicines are outlined above.  Medication changes, Labs and Tests ordered today are listed in the Patient Instructions below. There are no Patient Instructions on file for this visit.   Signed, Angelena Form, PA-C  06/09/2016 12:54 PM    [facility] [place], [place], [place]  [address] Phone: [phone]; Fax: [phone]

## 2016-06-10 ENCOUNTER — Ambulatory Visit (INDEPENDENT_AMBULATORY_CARE_PROVIDER_SITE_OTHER): Payer: Medicare Other | Admitting: Physician Assistant

## 2016-06-10 ENCOUNTER — Encounter: Payer: Self-pay | Admitting: Physician Assistant

## 2016-06-10 VITALS — BP 122/62 | HR 58 | Ht 64.0 in | Wt 150.2 lb

## 2016-06-10 DIAGNOSIS — R079 Chest pain, unspecified: Secondary | ICD-10-CM

## 2016-06-10 DIAGNOSIS — E785 Hyperlipidemia, unspecified: Secondary | ICD-10-CM | POA: Diagnosis not present

## 2016-06-10 DIAGNOSIS — I779 Disorder of arteries and arterioles, unspecified: Secondary | ICD-10-CM

## 2016-06-10 DIAGNOSIS — I2510 Atherosclerotic heart disease of native coronary artery without angina pectoris: Secondary | ICD-10-CM

## 2016-06-10 DIAGNOSIS — R002 Palpitations: Secondary | ICD-10-CM

## 2016-06-10 DIAGNOSIS — I471 Supraventricular tachycardia: Secondary | ICD-10-CM

## 2016-06-10 DIAGNOSIS — I2583 Coronary atherosclerosis due to lipid rich plaque: Secondary | ICD-10-CM

## 2016-06-10 DIAGNOSIS — I4510 Unspecified right bundle-branch block: Secondary | ICD-10-CM

## 2016-06-10 DIAGNOSIS — I4719 Other supraventricular tachycardia: Secondary | ICD-10-CM

## 2016-06-10 DIAGNOSIS — I739 Peripheral vascular disease, unspecified: Secondary | ICD-10-CM

## 2016-06-10 NOTE — Patient Instructions (Addendum)
Medication Instructions:  Your physician recommends that you continue on your current medications as directed. Please refer to the Current Medication list given to you today.   Labwork: None ordered  Testing/Procedures: Your physician has requested that you have a lexiscan myoview. For further information please visit [email or web address]. Please follow instruction sheet, as given.  Your physician has recommended that you wear an event monitor. Event monitors are medical devices that record the heart's electrical activity. Doctors most often [place] these monitors to diagnose arrhythmias. Arrhythmias are problems with the speed or rhythm of the heartbeat. The monitor is a small, portable device. You can wear one while you do your normal daily activities. This is usually used to diagnose what is causing palpitations/syncope (passing out).    Follow-Up: Your physician recommends that you schedule a follow-up appointment in: SEE DR. Marlou Porch AS SCHEDULED  Any Other Special Instructions Will Be Listed Below (If Applicable).  Pharmacologic Stress Electrocardiogram A pharmacologic stress electrocardiogram is a heart (cardiac) test that uses nuclear imaging to evaluate the blood supply to your heart. This test may also be called a pharmacologic stress electrocardiography. Pharmacologic means that a medicine is used to increase your heart rate and blood pressure.  This stress test is done to find areas of poor blood flow to the heart by determining the extent of coronary artery disease (CAD). Some people exercise on a treadmill, which naturally increases the blood flow to the heart. For those people unable to exercise on a treadmill, a medicine is used. This medicine stimulates your heart and will cause your heart to beat harder and more quickly, as if you were exercising.  Pharmacologic stress tests can help determine:  The adequacy of blood flow to your heart during increased levels of activity in order to  clear you for discharge home.  The extent of coronary artery blockage caused by CAD.  Your prognosis if you have suffered a heart attack.  The effectiveness of cardiac procedures done, such as an angioplasty, which can increase the circulation in your coronary arteries.  Causes of chest pain or pressure. LET [facility] CARE PROVIDER KNOW ABOUT:  Any allergies you have.  All medicines you are taking, including vitamins, herbs, eye drops, creams, and over-the-counter medicines.  Previous problems you or members of your family have had with the use of anesthetics.  Any blood disorders you have.  Previous surgeries you have had.  Medical conditions you have.  Possibility of pregnancy, if this applies.  If you are currently breastfeeding. RISKS AND COMPLICATIONS Generally, this is a safe procedure. However, as with any procedure, complications can occur. Possible complications include:  You develop pain or pressure in the following areas:  Chest.  Jaw or neck.  Between your shoulder blades.  Radiating down your left arm.  Headache.  Dizziness or light-headedness.  Shortness of breath.  Increased or irregular heartbeat.  Low blood pressure.  Nausea or vomiting.  Flushing.  Redness going up the arm and slight pain during injection of medicine.  Heart attack (rare). BEFORE THE PROCEDURE   Avoid all forms of caffeine for 24 hours before your test or as directed by your health care provider. This includes coffee, tea (even decaffeinated tea), caffeinated sodas, chocolate, cocoa, and certain pain medicines.  Follow your health care provider's instructions regarding eating and drinking before the test.  Take your medicines as directed at regular times with water unless instructed otherwise. Exceptions may include:  If you have diabetes, ask how you are to  take your insulin or pills. It is common to adjust insulin dosing the morning of the test.  If you are  taking beta-blocker medicines, it is important to talk to your health care provider about these medicines well before the date of your test. Taking beta-blocker medicines may interfere with the test. In some cases, these medicines need to be changed or stopped 24 hours or more before the test.  If you wear a nitroglycerin patch, it may need to be removed prior to the test. Ask your health care provider if the patch should be removed before the test.  If you use an inhaler for any breathing condition, bring it with you to the test.  If you are an outpatient, bring a snack so you can eat right after the stress phase of the test.  Do not smoke for 4 hours prior to the test or as directed by your health care provider.  Do not apply lotions, powders, creams, or oils on your chest prior to the test.  Wear comfortable shoes and clothing. Let your health care provider know if you were unable to complete or follow the preparations for your test. PROCEDURE   Multiple patches (electrodes) will be put on your chest. If needed, small areas of your chest may be shaved to get better contact with the electrodes. Once the electrodes are attached to your body, multiple wires will be attached to the electrodes, and your heart rate will be monitored.  An IV access will be started. A nuclear trace (isotope) is given. The isotope may be given intravenously, or it may be swallowed. Nuclear refers to several types of radioactive isotopes, and the nuclear isotope lights up the arteries so that the nuclear images are clear. The isotope is absorbed by your body. This results in low radiation exposure.  A resting nuclear image is taken to show how your heart functions at rest.  A medicine is given through the IV access.  A second scan is done about 1 hour after the medicine injection and determines how your heart functions under stress.  During this stress phase, you will be connected to an electrocardiogram machine.  Your blood pressure and oxygen levels will be monitored. AFTER THE PROCEDURE   Your heart rate and blood pressure will be monitored after the test.  You may return to your normal schedule, including diet,activities, and medicines, unless your health care provider tells you otherwise. This information is not intended to replace advice given to you by your health care provider. Make sure you discuss any questions you have with your health care provider. Document Released: 10/31/2008 Document Revised: 06/19/2013 Document Reviewed: 02/19/2013 Elsevier Interactive Patient Education  2017 [place].   Cardiac Event Monitoring A cardiac event monitor is a small recording device used to help detect abnormal heart rhythms (arrhythmias). The monitor is used to record heart rhythm when noticeable symptoms such as the following occur:  Fast heartbeats (palpitations), such as heart racing or fluttering.  Dizziness.  Fainting or light-headedness.  Unexplained weakness. The monitor is wired to two electrodes placed on your chest. Electrodes are flat, sticky disks that attach to your skin. The monitor can be worn for up to 30 days. You will wear the monitor at all times, except when bathing. How to use your cardiac event monitor A technician will prepare your chest for the electrode placement. The technician will show you how to place the electrodes, how to work the monitor, and how to replace the batteries.  Take time to practice using the monitor before you leave the office. Make sure you understand how to send the information from the monitor to your health care provider. This requires a telephone with a landline, not a cell phone. You need to:  Wear your monitor at all times, except when you are in water:  Do not get the monitor wet.  Take the monitor off when bathing. Do not swim or use a hot tub with it on.  Keep your skin clean. Do not put body lotion or moisturizer on your chest.  Change  the electrodes daily or any time they stop sticking to your skin. You might need to use tape to keep them on.  It is possible that your skin under the electrodes could become irritated. To keep this from happening, try to put the electrodes in slightly different places on your chest. However, they must remain in the area under your left breast and in the upper right section of your chest.  Make sure the monitor is safely clipped to your clothing or in a location close to your body that your health care provider recommends.  Press the button to record when you feel symptoms of heart trouble, such as dizziness, weakness, light-headedness, palpitations, thumping, shortness of breath, unexplained weakness, or a fluttering or racing heart. The monitor is always on and records what happened slightly before you pressed the button, so do not worry about being too late to get good information.  Keep a diary of your activities, such as walking, doing chores, and taking medicine. It is especially important to note what you were doing when you pushed the button to record your symptoms. This will help your health care provider determine what might be contributing to your symptoms. The information stored in your monitor will be reviewed by your health care provider alongside your diary entries.  Send the recorded information as recommended by your health care provider. It is important to understand that it will take some time for your health care provider to process the results.  Change the batteries as recommended by your health care provider. Get help right away if:  You have chest pain.  You have extreme difficulty breathing or shortness of breath.  You develop a very fast heartbeat that persists.  You develop dizziness that does not go away.  You faint or constantly feel you are about to faint. This information is not intended to replace advice given to you by your health care provider. Make sure you  discuss any questions you have with your health care provider. Document Released: 03/23/2008 Document Revised: 11/20/2015 Document Reviewed: 12/11/2012 Elsevier Interactive Patient Education  2017 [employer].   If you need a refill on your cardiac medications before your next appointment, please call your pharmacy.

## 2016-06-11 ENCOUNTER — Ambulatory Visit: Payer: Medicare Other | Admitting: Physician Assistant

## 2016-06-11 DIAGNOSIS — H2513 Age-related nuclear cataract, bilateral: Secondary | ICD-10-CM | POA: Diagnosis not present

## 2016-06-17 ENCOUNTER — Telehealth (HOSPITAL_COMMUNITY): Payer: Self-pay | Admitting: *Deleted

## 2016-06-17 NOTE — Telephone Encounter (Signed)
Patient given detailed instructions per Myocardial Perfusion Study Information Sheet for the test on 06/22/16. Patient notified to arrive 15 minutes early and that it is imperative to arrive on time for appointment to keep from having the test rescheduled.  If you need to cancel or reschedule your appointment, please call the office within 24 hours of your appointment. Failure to do so may result in a cancellation of your appointment, and a $50 no show fee. Patient verbalized understanding. [name] Jacqueline    

## 2016-06-22 ENCOUNTER — Ambulatory Visit (INDEPENDENT_AMBULATORY_CARE_PROVIDER_SITE_OTHER): Payer: Medicare Other

## 2016-06-22 ENCOUNTER — Other Ambulatory Visit: Payer: Self-pay | Admitting: Physician Assistant

## 2016-06-22 ENCOUNTER — Ambulatory Visit (HOSPITAL_COMMUNITY): Payer: Medicare Other | Attending: Internal Medicine

## 2016-06-22 DIAGNOSIS — R42 Dizziness and giddiness: Secondary | ICD-10-CM

## 2016-06-22 DIAGNOSIS — R0609 Other forms of dyspnea: Secondary | ICD-10-CM | POA: Insufficient documentation

## 2016-06-22 DIAGNOSIS — R079 Chest pain, unspecified: Secondary | ICD-10-CM

## 2016-06-22 DIAGNOSIS — I779 Disorder of arteries and arterioles, unspecified: Secondary | ICD-10-CM | POA: Insufficient documentation

## 2016-06-22 DIAGNOSIS — R002 Palpitations: Secondary | ICD-10-CM | POA: Insufficient documentation

## 2016-06-22 DIAGNOSIS — I10 Essential (primary) hypertension: Secondary | ICD-10-CM | POA: Diagnosis not present

## 2016-06-22 DIAGNOSIS — I2510 Atherosclerotic heart disease of native coronary artery without angina pectoris: Secondary | ICD-10-CM | POA: Diagnosis not present

## 2016-06-22 DIAGNOSIS — R0789 Other chest pain: Secondary | ICD-10-CM | POA: Diagnosis not present

## 2016-06-22 DIAGNOSIS — I4510 Unspecified right bundle-branch block: Secondary | ICD-10-CM | POA: Diagnosis not present

## 2016-06-22 LAB — MYOCARDIAL PERFUSION IMAGING
CHL CUP NUCLEAR SDS: 3
CHL CUP NUCLEAR SRS: 2
CHL CUP NUCLEAR SSS: 5
LHR: 0.26
LV dias vol: 73 mL (ref 46–106)
LV sys vol: 18 mL
Peak HR: 86 {beats}/min
Rest HR: 59 {beats}/min
TID: 0.95

## 2016-06-22 MED ORDER — REGADENOSON 0.4 MG/5ML IV SOLN
0.4000 mg | Freq: Once | INTRAVENOUS | Status: AC
  Administered 2016-06-22: 0.4 mg via INTRAVENOUS

## 2016-06-22 MED ORDER — TECHNETIUM TC 99M TETROFOSMIN IV KIT
10.4000 | PACK | Freq: Once | INTRAVENOUS | Status: AC | PRN
  Administered 2016-06-22: 10.4 via INTRAVENOUS
  Filled 2016-06-22: qty 11

## 2016-06-22 MED ORDER — TECHNETIUM TC 99M TETROFOSMIN IV KIT
31.4000 | PACK | Freq: Once | INTRAVENOUS | Status: AC | PRN
Start: 2016-06-22 — End: 2016-06-22
  Administered 2016-06-22: 31.4 via INTRAVENOUS
  Filled 2016-06-22: qty 32

## 2016-07-09 DIAGNOSIS — N390 Urinary tract infection, site not specified: Secondary | ICD-10-CM | POA: Diagnosis not present

## 2016-07-09 DIAGNOSIS — R319 Hematuria, unspecified: Secondary | ICD-10-CM | POA: Diagnosis not present

## 2016-07-26 DIAGNOSIS — R079 Chest pain, unspecified: Secondary | ICD-10-CM | POA: Diagnosis not present

## 2016-07-26 DIAGNOSIS — N390 Urinary tract infection, site not specified: Secondary | ICD-10-CM | POA: Diagnosis not present

## 2016-08-04 DIAGNOSIS — J309 Allergic rhinitis, unspecified: Secondary | ICD-10-CM | POA: Diagnosis not present

## 2016-08-04 DIAGNOSIS — H6993 Unspecified Eustachian tube disorder, bilateral: Secondary | ICD-10-CM | POA: Diagnosis not present

## 2016-08-06 ENCOUNTER — Telehealth: Payer: Self-pay | Admitting: Physician Assistant

## 2016-08-06 NOTE — Telephone Encounter (Signed)
New Message    Pt returning call from 08/04/16.

## 2016-08-06 NOTE — Telephone Encounter (Signed)
Notes Recorded by Eileen Stanford, PA-C on 08/04/2016   Overall very reasurring monitor with rare PACs. Continue to monitor for now.  08/06/16 --discussed results with patient.

## 2016-08-09 ENCOUNTER — Other Ambulatory Visit: Payer: Self-pay | Admitting: Obstetrics & Gynecology

## 2016-08-09 DIAGNOSIS — Z1231 Encounter for screening mammogram for malignant neoplasm of breast: Secondary | ICD-10-CM

## 2016-08-24 DIAGNOSIS — W19XXXA Unspecified fall, initial encounter: Secondary | ICD-10-CM | POA: Diagnosis not present

## 2016-08-24 DIAGNOSIS — Y93H2 Activity, gardening and landscaping: Secondary | ICD-10-CM | POA: Diagnosis not present

## 2016-08-24 DIAGNOSIS — S0093XA Contusion of unspecified part of head, initial encounter: Secondary | ICD-10-CM | POA: Diagnosis not present

## 2016-08-30 DIAGNOSIS — H6091 Unspecified otitis externa, right ear: Secondary | ICD-10-CM | POA: Diagnosis not present

## 2016-09-10 ENCOUNTER — Encounter: Payer: Self-pay | Admitting: Cardiology

## 2016-09-10 ENCOUNTER — Encounter (INDEPENDENT_AMBULATORY_CARE_PROVIDER_SITE_OTHER): Payer: Self-pay

## 2016-09-10 ENCOUNTER — Ambulatory Visit (INDEPENDENT_AMBULATORY_CARE_PROVIDER_SITE_OTHER): Payer: Medicare Other | Admitting: Cardiology

## 2016-09-10 VITALS — BP 132/68 | HR 76 | Ht 66.0 in | Wt 153.0 lb

## 2016-09-10 DIAGNOSIS — I2583 Coronary atherosclerosis due to lipid rich plaque: Secondary | ICD-10-CM | POA: Diagnosis not present

## 2016-09-10 DIAGNOSIS — I10 Essential (primary) hypertension: Secondary | ICD-10-CM

## 2016-09-10 DIAGNOSIS — I4510 Unspecified right bundle-branch block: Secondary | ICD-10-CM

## 2016-09-10 DIAGNOSIS — R002 Palpitations: Secondary | ICD-10-CM

## 2016-09-10 DIAGNOSIS — I700 Atherosclerosis of aorta: Secondary | ICD-10-CM

## 2016-09-10 DIAGNOSIS — I471 Supraventricular tachycardia: Secondary | ICD-10-CM

## 2016-09-10 DIAGNOSIS — E785 Hyperlipidemia, unspecified: Secondary | ICD-10-CM | POA: Diagnosis not present

## 2016-09-10 DIAGNOSIS — I2510 Atherosclerotic heart disease of native coronary artery without angina pectoris: Secondary | ICD-10-CM

## 2016-09-10 NOTE — Progress Notes (Signed)
Cardiology Office Note    Date:  09/10/2016   ID:  [name], DOB 10-21-1935, MRN [id number]  PCP:  Taylor Screws, MD  Cardiologist:   Taylor Furbish, MD     History of Present Illness:  [name] is a 81 y.o. female here last on 06/10/16 with paroxysmal atrial tachycardia, chest pain seen by Taylor Range, PA.  Has a nonobstructive coronary artery disease history, right bundle branch block, hyperlipidemia, hypertension, carotid artery stenosis.  She was out shopping, felt weak, dizzy with severe chest pain going into her neck, sublingual nitroglycerin seemed to help. 5 years ago she had a similar episode while shopping.  Chronic lower extremity edema and lower show any pain which she thinks is from Crestor. Not much activity.  She underwent a stress test which was reassuring.  She had been on atenolol for quite some time which was switched to diltiazem because of hair falling out.  Past Medical History:  Diagnosis Date  . CAD (coronary artery disease)   . Chest pain   . Colon polyp   . DJD (degenerative joint disease)   . GERD (gastroesophageal reflux disease)   . Hypercholesteremia   . Hypertension   . IBS (irritable bowel syndrome)   . Neck pain   . Occlusion and stenosis of carotid artery without mention of cerebral infarction   . Palpitation   . Paresthesia   . PVD (peripheral vascular disease) ([place])   . RBBB   . Situational depression   . Varicose veins    bilateral legs    Past Surgical History:  Procedure Laterality Date  . APPENDECTOMY    . BREAST BIOPSY    . KNEE ARTHROSCOPY Right 01/02/2016   Procedure: RIGHT ARTHROSCOPY KNEE PARTIAL MEDIAL AND LATERAL MENISCECTOMY AND DEBRIDEMENT;  Surgeon: Susa Day, MD;  Location: [facility];  Service: Orthopedics;  Laterality: Right;  . LAPAROSCOPIC CHOLECYSTECTOMY     2011   . ROTATOR CUFF REPAIR Right   . TONSILLECTOMY    . TUBAL LIGATION Bilateral     Current Medications: Outpatient  Medications Prior to Visit  Medication Sig Dispense Refill  . aspirin 81 MG tablet Take 81 mg by mouth daily.    . calcium carbonate (OS-CAL) 600 MG tablet Take 600 mg by mouth 2 (two) times daily with a meal.    . Calcium Carbonate-Vitamin D3 (CALCIUM 600/VITAMIN D) 600-400 MG-UNIT TABS Take 1 capsule by mouth daily.    Marland Kitchen diltiazem (CARDIZEM CD) 120 MG 24 hr capsule TAKE 1 CAPSULE (120 MG TOTAL) BY MOUTH DAILY. 90 capsule 3  . docusate sodium (COLACE) 100 MG capsule Take 100-200 mg by mouth daily as needed for mild constipation or moderate constipation.    . lansoprazole (PREVACID) 15 MG capsule Take 15 mg by mouth every other day.    . lisinopril-hydrochlorothiazide (PRINZIDE,ZESTORETIC) 20-12.5 MG tablet Take 0.5 tablets by mouth 2 (two) times daily. 90 tablet 2  . nitroGLYCERIN (NITROSTAT) 0.4 MG SL tablet Place 1 tablet (0.4 mg total) under the tongue every 5 (five) minutes as needed for chest pain. 25 tablet 5  . Omega-3 Fatty Acids (FISH OIL PO) Take 1 capsule by mouth 2 (two) times daily.     . psyllium (METAMUCIL) 58.6 % powder Take 1 packet by mouth daily.    . rosuvastatin (CRESTOR) 5 MG tablet TAKE 1 TABLET BY MOUTH AS DIRECTED 90 tablet 0  . loratadine (CLARITIN) 10 MG tablet Take 10 mg by mouth daily as needed.  No facility-administered medications prior to visit.      Allergies:   Ceftin [cefuroxime axetil]; Ciprofloxacin hcl; and Nitrofuran derivatives   Social History   Social History  . Marital status: Widowed    Spouse name: N/A  . Number of children: N/A  . Years of education: N/A   Social History Main Topics  . Smoking status: Former Research scientist (life sciences)  . Smokeless tobacco: Never Used     Comment: Quit smoking age 46   . Alcohol use No  . Drug use: No  . Sexual activity: Not Asked   Other Topics Concern  . None   Social History Narrative  . None     Family History:  The patient's family history includes Heart attack in her father; Kidney disease in her father.     ROS:   Please see the history of present illness.    ROS All other systems reviewed and are negative.   PHYSICAL EXAM:   VS:  BP 132/68   Pulse 76   Ht 5\' 6"  (1.676 m)   Wt 153 lb (69.4 kg)   SpO2 98%   BMI 24.69 kg/m    GEN: Well nourished, well developed, in no acute distress  HEENT: normal  Neck: no JVD, carotid bruits, or masses Cardiac: RRR; no murmurs, rubs, or gallops,no edema  Respiratory:  clear to auscultation bilaterally, normal work of breathing GI: soft, nontender, nondistended, + BS MS: no deformity or atrophy  Skin: warm and dry, no rash Neuro:  Alert and Oriented x 3, Strength and sensation are intact Psych: euthymic mood, full affect  Wt Readings from Last 3 Encounters:  09/10/16 153 lb (69.4 kg)  06/22/16 150 lb (68 kg)  06/10/16 150 lb 4 oz (68.2 kg)      Studies/Labs Reviewed:   EKG:  EKG is ordered today.  The ekg ordered today demonstrates a 09/10/16-sinus rhythm 67 with right bundle branch block, no change from prior, personally viewed  Recent Labs: 12/23/2015: BUN 20; Creatinine, Ser 0.66; Hemoglobin 13.4; Platelets 239; Potassium 5.4; Sodium 138   Lipid Panel    Component Value Date/Time   CHOL 163 07/24/2013 0913   TRIG 86.0 07/24/2013 0913   HDL 61.80 07/24/2013 0913   CHOLHDL 3 07/24/2013 0913   VLDL 17.2 07/24/2013 0913   LDLCALC 84 07/24/2013 0913    Additional studies/ records that were reviewed today include:   NUC stress: 06/22/16:  The left ventricular ejection fraction is hyperdynamic (>65%).  Nuclear stress EF: 75%.  There was no ST segment deviation noted during stress.  The study is normal.  This is a low risk study.  Cardiac catheterization 2012-nonobstructive CAD-reassuring.  Event monitor 06/22/16:  Rare premature atrial contractions. Relate to symptoms of palpitations  No atrial fibrillation or flutter or significant paroxysmal atrial tachycardia   Reassuring monitor  ASSESSMENT:    1. PAT  (paroxysmal atrial tachycardia) ([place])   2. Essential hypertension   3. Coronary artery disease due to lipid rich plaque   4. Palpitations   5. RBBB   6. Hyperlipidemia, unspecified hyperlipidemia type   7. Aortic atherosclerosis ([facility])      PLAN:  In order of problems listed above:  Paroxysmal atrial tachycardia  - Event monitor 2017 showed no atrial fibrillation. PAT noted.  - Diltiazem 120 mg once a day. No longer on atenolol because of hair loss.  - I asked her if she would be interested in increasing the diltiazem but because of potential constipation side effects  she would not like to do this.  - She still feels these palpitations periodically. I reassured her that they are not dangerous.  Right bundle branch block  - Chronic. No change.  - No syncope.  Post knee replacement bilaterally  - Encourage exercise, activity. Minor lower extremity edema can be present in the situations.  Nonobstructive coronary artery disease  - Stable, no anginal symptoms. Reassurance.  Aortic atherosclerosis  - Very mild disease noted on CT of abdomen pelvis in 2015.  - Continue with hypertension control, lipid control, exercise. Diet control. Statin.  Hyperlipidemia  - Continue with statin medication.    Medication Adjustments/Labs and Tests Ordered: Current medicines are reviewed at length with the patient today.  Concerns regarding medicines are outlined above.  Medication changes, Labs and Tests ordered today are listed in the Patient Instructions below. Patient Instructions  Medication Instructions:  The current medical regimen is effective;  continue present plan and medications.  Follow-Up: Follow up in 1 year with Dr. Marlou Porch.  You will receive a letter in the mail 2 months before you are due.  Please call us when you receive this letter to schedule your follow up appointment.  If you need a refill on your cardiac medications before your next appointment, please call your  pharmacy.  Thank you for choosing [facility]!!        Signed, Taylor Furbish, MD  09/10/2016 11:21 AM    [facility] [address], [place], [place]  [address] Phone: [phone]; Fax: [phone]

## 2016-09-10 NOTE — Patient Instructions (Signed)

## 2016-09-13 ENCOUNTER — Ambulatory Visit
Admission: RE | Admit: 2016-09-13 | Discharge: 2016-09-13 | Disposition: A | Discharge status: Home / Self Care | Payer: Medicare Other | Source: Ambulatory Visit | Attending: Obstetrics & Gynecology | Admitting: Obstetrics & Gynecology

## 2016-09-13 DIAGNOSIS — Z1231 Encounter for screening mammogram for malignant neoplasm of breast: Secondary | ICD-10-CM | POA: Diagnosis not present

## 2016-09-14 ENCOUNTER — Other Ambulatory Visit: Payer: Self-pay | Admitting: Obstetrics & Gynecology

## 2016-09-14 DIAGNOSIS — R928 Other abnormal and inconclusive findings on diagnostic imaging of breast: Secondary | ICD-10-CM

## 2016-09-17 ENCOUNTER — Ambulatory Visit
Admission: RE | Admit: 2016-09-17 | Discharge: 2016-09-17 | Disposition: A | Discharge status: Home / Self Care | Payer: Medicare Other | Source: Ambulatory Visit | Attending: Obstetrics & Gynecology | Admitting: Obstetrics & Gynecology

## 2016-09-17 DIAGNOSIS — N6489 Other specified disorders of breast: Secondary | ICD-10-CM | POA: Diagnosis not present

## 2016-09-17 DIAGNOSIS — R922 Inconclusive mammogram: Secondary | ICD-10-CM | POA: Diagnosis not present

## 2016-09-17 DIAGNOSIS — R928 Other abnormal and inconclusive findings on diagnostic imaging of breast: Secondary | ICD-10-CM

## 2016-09-20 ENCOUNTER — Other Ambulatory Visit: Payer: Self-pay | Admitting: Cardiology

## 2016-09-27 ENCOUNTER — Other Ambulatory Visit: Payer: Self-pay | Admitting: Cardiology

## 2016-10-19 DIAGNOSIS — L814 Other melanin hyperpigmentation: Secondary | ICD-10-CM | POA: Diagnosis not present

## 2016-10-19 DIAGNOSIS — L570 Actinic keratosis: Secondary | ICD-10-CM | POA: Diagnosis not present

## 2016-10-19 DIAGNOSIS — Z85828 Personal history of other malignant neoplasm of skin: Secondary | ICD-10-CM | POA: Diagnosis not present

## 2016-10-19 DIAGNOSIS — L218 Other seborrheic dermatitis: Secondary | ICD-10-CM | POA: Diagnosis not present

## 2016-10-19 DIAGNOSIS — D0462 Carcinoma in situ of skin of left upper limb, including shoulder: Secondary | ICD-10-CM | POA: Diagnosis not present

## 2016-10-19 DIAGNOSIS — D225 Melanocytic nevi of trunk: Secondary | ICD-10-CM | POA: Diagnosis not present

## 2016-10-19 DIAGNOSIS — L821 Other seborrheic keratosis: Secondary | ICD-10-CM | POA: Diagnosis not present

## 2016-10-19 DIAGNOSIS — D2272 Melanocytic nevi of left lower limb, including hip: Secondary | ICD-10-CM | POA: Diagnosis not present

## 2016-10-27 DIAGNOSIS — L570 Actinic keratosis: Secondary | ICD-10-CM | POA: Diagnosis not present

## 2016-10-27 DIAGNOSIS — Z85828 Personal history of other malignant neoplasm of skin: Secondary | ICD-10-CM | POA: Diagnosis not present

## 2016-10-27 DIAGNOSIS — D0462 Carcinoma in situ of skin of left upper limb, including shoulder: Secondary | ICD-10-CM | POA: Diagnosis not present

## 2016-12-23 DIAGNOSIS — N760 Acute vaginitis: Secondary | ICD-10-CM | POA: Diagnosis not present

## 2017-02-10 DIAGNOSIS — J300 Vasomotor rhinitis: Secondary | ICD-10-CM | POA: Diagnosis not present

## 2017-02-10 DIAGNOSIS — H6993 Unspecified Eustachian tube disorder, bilateral: Secondary | ICD-10-CM | POA: Diagnosis not present

## 2017-02-25 ENCOUNTER — Other Ambulatory Visit: Payer: Self-pay | Admitting: Internal Medicine

## 2017-02-25 DIAGNOSIS — Z1389 Encounter for screening for other disorder: Secondary | ICD-10-CM | POA: Diagnosis not present

## 2017-02-25 DIAGNOSIS — Z86010 Personal history of colonic polyps: Secondary | ICD-10-CM | POA: Diagnosis not present

## 2017-02-25 DIAGNOSIS — Z79899 Other long term (current) drug therapy: Secondary | ICD-10-CM | POA: Diagnosis not present

## 2017-02-25 DIAGNOSIS — K219 Gastro-esophageal reflux disease without esophagitis: Secondary | ICD-10-CM | POA: Diagnosis not present

## 2017-02-25 DIAGNOSIS — K5730 Diverticulosis of large intestine without perforation or abscess without bleeding: Secondary | ICD-10-CM | POA: Diagnosis not present

## 2017-02-25 DIAGNOSIS — Z0001 Encounter for general adult medical examination with abnormal findings: Secondary | ICD-10-CM | POA: Diagnosis not present

## 2017-02-25 DIAGNOSIS — I2510 Atherosclerotic heart disease of native coronary artery without angina pectoris: Secondary | ICD-10-CM | POA: Diagnosis not present

## 2017-02-25 DIAGNOSIS — I10 Essential (primary) hypertension: Secondary | ICD-10-CM | POA: Diagnosis not present

## 2017-02-25 DIAGNOSIS — E538 Deficiency of other specified B group vitamins: Secondary | ICD-10-CM | POA: Diagnosis not present

## 2017-02-25 DIAGNOSIS — K589 Irritable bowel syndrome without diarrhea: Secondary | ICD-10-CM | POA: Diagnosis not present

## 2017-02-25 DIAGNOSIS — I779 Disorder of arteries and arterioles, unspecified: Secondary | ICD-10-CM | POA: Diagnosis not present

## 2017-02-25 DIAGNOSIS — E559 Vitamin D deficiency, unspecified: Secondary | ICD-10-CM | POA: Diagnosis not present

## 2017-02-25 DIAGNOSIS — R3121 Asymptomatic microscopic hematuria: Secondary | ICD-10-CM | POA: Diagnosis not present

## 2017-02-25 DIAGNOSIS — G4700 Insomnia, unspecified: Secondary | ICD-10-CM | POA: Diagnosis not present

## 2017-02-25 DIAGNOSIS — I739 Peripheral vascular disease, unspecified: Principal | ICD-10-CM

## 2017-03-07 ENCOUNTER — Ambulatory Visit
Admission: RE | Admit: 2017-03-07 | Discharge: 2017-03-07 | Disposition: A | Discharge status: Home / Self Care | Payer: Medicare Other | Source: Ambulatory Visit | Attending: Internal Medicine | Admitting: Internal Medicine

## 2017-03-07 DIAGNOSIS — I739 Peripheral vascular disease, unspecified: Principal | ICD-10-CM

## 2017-03-07 DIAGNOSIS — I779 Disorder of arteries and arterioles, unspecified: Secondary | ICD-10-CM

## 2017-03-07 DIAGNOSIS — I6523 Occlusion and stenosis of bilateral carotid arteries: Secondary | ICD-10-CM | POA: Diagnosis not present

## 2017-04-11 DIAGNOSIS — R319 Hematuria, unspecified: Secondary | ICD-10-CM | POA: Diagnosis not present

## 2017-04-11 DIAGNOSIS — N958 Other specified menopausal and perimenopausal disorders: Secondary | ICD-10-CM | POA: Diagnosis not present

## 2017-04-11 DIAGNOSIS — Z124 Encounter for screening for malignant neoplasm of cervix: Secondary | ICD-10-CM | POA: Diagnosis not present

## 2017-04-11 DIAGNOSIS — R102 Pelvic and perineal pain: Secondary | ICD-10-CM | POA: Diagnosis not present

## 2017-04-11 DIAGNOSIS — M8588 Other specified disorders of bone density and structure, other site: Secondary | ICD-10-CM | POA: Diagnosis not present

## 2017-04-11 DIAGNOSIS — Z6827 Body mass index (BMI) 27.0-27.9, adult: Secondary | ICD-10-CM | POA: Diagnosis not present

## 2017-04-11 DIAGNOSIS — R351 Nocturia: Secondary | ICD-10-CM | POA: Diagnosis not present

## 2017-04-15 DIAGNOSIS — Z23 Encounter for immunization: Secondary | ICD-10-CM | POA: Diagnosis not present

## 2017-04-26 DIAGNOSIS — F4321 Adjustment disorder with depressed mood: Secondary | ICD-10-CM | POA: Diagnosis not present

## 2017-04-26 DIAGNOSIS — E782 Mixed hyperlipidemia: Secondary | ICD-10-CM | POA: Diagnosis not present

## 2017-04-26 DIAGNOSIS — F329 Major depressive disorder, single episode, unspecified: Secondary | ICD-10-CM | POA: Diagnosis not present

## 2017-04-26 DIAGNOSIS — I2510 Atherosclerotic heart disease of native coronary artery without angina pectoris: Secondary | ICD-10-CM | POA: Diagnosis not present

## 2017-04-26 DIAGNOSIS — I10 Essential (primary) hypertension: Secondary | ICD-10-CM | POA: Diagnosis not present

## 2017-05-03 DIAGNOSIS — I10 Essential (primary) hypertension: Secondary | ICD-10-CM | POA: Diagnosis not present

## 2017-05-03 DIAGNOSIS — R109 Unspecified abdominal pain: Secondary | ICD-10-CM | POA: Diagnosis not present

## 2017-05-09 DIAGNOSIS — R319 Hematuria, unspecified: Secondary | ICD-10-CM | POA: Diagnosis not present

## 2017-05-16 DIAGNOSIS — I10 Essential (primary) hypertension: Secondary | ICD-10-CM | POA: Diagnosis not present

## 2017-05-25 ENCOUNTER — Other Ambulatory Visit: Payer: Self-pay | Admitting: Cardiology

## 2017-05-26 DIAGNOSIS — F329 Major depressive disorder, single episode, unspecified: Secondary | ICD-10-CM | POA: Diagnosis not present

## 2017-05-26 DIAGNOSIS — E782 Mixed hyperlipidemia: Secondary | ICD-10-CM | POA: Diagnosis not present

## 2017-05-26 DIAGNOSIS — I10 Essential (primary) hypertension: Secondary | ICD-10-CM | POA: Diagnosis not present

## 2017-05-26 DIAGNOSIS — I2510 Atherosclerotic heart disease of native coronary artery without angina pectoris: Secondary | ICD-10-CM | POA: Diagnosis not present

## 2017-05-26 DIAGNOSIS — F4321 Adjustment disorder with depressed mood: Secondary | ICD-10-CM | POA: Diagnosis not present

## 2017-05-31 ENCOUNTER — Telehealth: Payer: Self-pay | Admitting: Cardiology

## 2017-05-31 NOTE — Telephone Encounter (Signed)
Spoke with pt who is reporting Tammy Eckard wants to increase pt's Diltiazem to 180 mg a day for better control pt's BP.  BP has been 173/81, 160/72 and 160/77 with HR between 55-58 bpm.  Advised Dr Marlou Porch did discuss increasing Dilt with her back in March when she was seen here however she didn't want to d/t possible increase in constipation.  Advised I will forward this to Dr Marlou Porch for his review and recommendations/orders.  Pt will await a c/b with further information.

## 2017-05-31 NOTE — Telephone Encounter (Signed)
Patient calling, sees the pharmacist at her PCP and Jacki Cones believes that patient's BP medication should be changed.

## 2017-06-01 NOTE — Telephone Encounter (Signed)
I would say it's OK to increase Diltiazem to 180mg  QD.  Candee Furbish, MD

## 2017-06-02 MED ORDER — DILTIAZEM HCL ER COATED BEADS 180 MG PO CP24: 180.0000 mg | ORAL_CAPSULE | Freq: Every day | ORAL | 3 refills | Status: DC

## 2017-06-02 NOTE — Telephone Encounter (Signed)
Pt aware OK to increase Diltiazem to 180 mg a day.  RX sent into pharmacy as requested.  She will c/b with any questions or concerns.

## 2017-06-09 DIAGNOSIS — H2513 Age-related nuclear cataract, bilateral: Secondary | ICD-10-CM | POA: Diagnosis not present

## 2017-08-03 ENCOUNTER — Other Ambulatory Visit: Payer: Self-pay | Admitting: Internal Medicine

## 2017-08-03 DIAGNOSIS — R350 Frequency of micturition: Secondary | ICD-10-CM

## 2017-08-03 DIAGNOSIS — R109 Unspecified abdominal pain: Secondary | ICD-10-CM | POA: Diagnosis not present

## 2017-08-08 ENCOUNTER — Ambulatory Visit
Admission: RE | Admit: 2017-08-08 | Discharge: 2017-08-08 | Disposition: A | Discharge status: Home / Self Care | Payer: Medicare Other | Source: Ambulatory Visit | Attending: Internal Medicine | Admitting: Internal Medicine

## 2017-08-08 DIAGNOSIS — N281 Cyst of kidney, acquired: Secondary | ICD-10-CM | POA: Diagnosis not present

## 2017-08-08 DIAGNOSIS — R350 Frequency of micturition: Secondary | ICD-10-CM

## 2017-08-17 ENCOUNTER — Other Ambulatory Visit: Payer: Self-pay | Admitting: Obstetrics & Gynecology

## 2017-08-17 DIAGNOSIS — N6489 Other specified disorders of breast: Secondary | ICD-10-CM

## 2017-08-27 ENCOUNTER — Other Ambulatory Visit: Payer: Self-pay | Admitting: Cardiology

## 2017-08-30 DIAGNOSIS — F4321 Adjustment disorder with depressed mood: Secondary | ICD-10-CM | POA: Diagnosis not present

## 2017-08-30 DIAGNOSIS — F329 Major depressive disorder, single episode, unspecified: Secondary | ICD-10-CM | POA: Diagnosis not present

## 2017-08-30 DIAGNOSIS — I2510 Atherosclerotic heart disease of native coronary artery without angina pectoris: Secondary | ICD-10-CM | POA: Diagnosis not present

## 2017-08-30 DIAGNOSIS — E782 Mixed hyperlipidemia: Secondary | ICD-10-CM | POA: Diagnosis not present

## 2017-08-30 DIAGNOSIS — R109 Unspecified abdominal pain: Secondary | ICD-10-CM | POA: Diagnosis not present

## 2017-08-30 DIAGNOSIS — E871 Hypo-osmolality and hyponatremia: Secondary | ICD-10-CM | POA: Diagnosis not present

## 2017-08-30 DIAGNOSIS — I10 Essential (primary) hypertension: Secondary | ICD-10-CM | POA: Diagnosis not present

## 2017-08-30 DIAGNOSIS — R3129 Other microscopic hematuria: Secondary | ICD-10-CM | POA: Diagnosis not present

## 2017-08-30 DIAGNOSIS — R350 Frequency of micturition: Secondary | ICD-10-CM | POA: Diagnosis not present

## 2017-09-12 ENCOUNTER — Other Ambulatory Visit: Payer: Self-pay | Admitting: Cardiology

## 2017-09-15 ENCOUNTER — Ambulatory Visit
Admission: RE | Admit: 2017-09-15 | Discharge: 2017-09-15 | Disposition: A | Discharge status: Home / Self Care | Payer: Medicare Other | Source: Ambulatory Visit | Attending: Obstetrics & Gynecology | Admitting: Obstetrics & Gynecology

## 2017-09-15 ENCOUNTER — Ambulatory Visit: Payer: Medicare Other

## 2017-09-15 DIAGNOSIS — R922 Inconclusive mammogram: Secondary | ICD-10-CM | POA: Diagnosis not present

## 2017-09-15 DIAGNOSIS — N6489 Other specified disorders of breast: Secondary | ICD-10-CM

## 2017-09-26 ENCOUNTER — Ambulatory Visit (INDEPENDENT_AMBULATORY_CARE_PROVIDER_SITE_OTHER): Payer: Medicare Other | Admitting: Cardiology

## 2017-09-26 ENCOUNTER — Encounter: Payer: Self-pay | Admitting: Cardiology

## 2017-09-26 VITALS — BP 128/84 | HR 59 | Ht 64.0 in | Wt 157.1 lb

## 2017-09-26 DIAGNOSIS — I700 Atherosclerosis of aorta: Secondary | ICD-10-CM | POA: Diagnosis not present

## 2017-09-26 DIAGNOSIS — I471 Supraventricular tachycardia: Secondary | ICD-10-CM

## 2017-09-26 DIAGNOSIS — R002 Palpitations: Secondary | ICD-10-CM | POA: Diagnosis not present

## 2017-09-26 DIAGNOSIS — I4510 Unspecified right bundle-branch block: Secondary | ICD-10-CM | POA: Diagnosis not present

## 2017-09-26 DIAGNOSIS — I10 Essential (primary) hypertension: Secondary | ICD-10-CM | POA: Diagnosis not present

## 2017-09-26 NOTE — Patient Instructions (Signed)

## 2017-09-26 NOTE — Progress Notes (Signed)
Cardiology Office Note    Date:  09/26/2017   ID:  [name], DOB [birth year], MRN [id number]  PCP:  Josetta Huddle, MD  Cardiologist:   Candee Furbish, MD     History of Present Illness:  [name] is a 82 y.o. female here for follow-up with paroxysmal atrial tachycardia.  Has nonobstructive coronary artery disease history, right bundle branch block, hyperlipidemia, hypertension, minimal carotid artery plaque.  Previously she described shopping, felt weak, dizzy with severe chest pain going into her neck, sublingual nitroglycerin seemed to help. 5 years ago she had a similar episode while shopping.  Chronic lower extremity edema trace ankle.   She underwent a stress test which was reassuring.  She had been on atenolol for quite some time which was switched to diltiazem because of hair falling out.  09/26/17 -she had an episode of chest discomfort after leaning over after a drawer fell out of the dresser.  She bent over to help pick up things and then when she got up she felt some tightness in her chest.  Possibly musculoskeletal.  She said that after a few more minutes it did not subside, she took a nitroglycerin and this seemed to help.  She is tolerating the diltiazem well.  Denies any shortness of breath fevers chills nausea vomiting  Past Medical History:  Diagnosis Date  . CAD (coronary artery disease)   . Chest pain   . Colon polyp   . DJD (degenerative joint disease)   . GERD (gastroesophageal reflux disease)   . Hypercholesteremia   . Hypertension   . IBS (irritable bowel syndrome)   . Neck pain   . Occlusion and stenosis of carotid artery without mention of cerebral infarction   . Palpitation   . Paresthesia   . PVD (peripheral vascular disease) ([place])   . RBBB   . Situational depression   . Varicose veins    bilateral legs    Past Surgical History:  Procedure Laterality Date  . APPENDECTOMY    . BREAST BIOPSY    . BREAST EXCISIONAL BIOPSY Bilateral  1995  . KNEE ARTHROSCOPY Right 01/02/2016   Procedure: RIGHT ARTHROSCOPY KNEE PARTIAL MEDIAL AND LATERAL MENISCECTOMY AND DEBRIDEMENT;  Surgeon: Susa Day, MD;  Location: [facility];  Service: Orthopedics;  Laterality: Right;  . LAPAROSCOPIC CHOLECYSTECTOMY     2011   . ROTATOR CUFF REPAIR Right   . TONSILLECTOMY    . TUBAL LIGATION Bilateral     Current Medications: Outpatient Medications Prior to Visit  Medication Sig Dispense Refill  . aspirin 81 MG tablet Take 81 mg by mouth daily.    . Calcium Carbonate-Vitamin D3 (CALCIUM 600/VITAMIN D) 600-400 MG-UNIT TABS Take 1 capsule by mouth daily.    . cetirizine (ZYRTEC) 5 MG tablet Take 5 mg by mouth daily.    Marland Kitchen diltiazem (CARDIZEM CD) 180 MG 24 hr capsule Take 1 capsule (180 mg total) by mouth daily. 90 capsule 3  . docusate sodium (COLACE) 100 MG capsule Take 100-200 mg by mouth daily as needed for mild constipation or moderate constipation.    . lansoprazole (PREVACID) 15 MG capsule Take 15 mg by mouth every other day.    . lisinopril-hydrochlorothiazide (PRINZIDE,ZESTORETIC) 20-12.5 MG tablet TAKE 1/2 TABLET BY MOUTH TWICE A DAY 90 tablet 0  . nitroGLYCERIN (NITROSTAT) 0.4 MG SL tablet Place 1 tablet (0.4 mg total) under the tongue every 5 (five) minutes as needed for chest pain. 25 tablet 5  . Omega-3  Fatty Acids (FISH OIL PO) Take 1 capsule by mouth 2 (two) times daily.     . psyllium (METAMUCIL) 58.6 % powder Take 1 packet by mouth daily.    . rosuvastatin (CRESTOR) 5 MG tablet Take 1 tablet (5 mg total) by mouth daily. Please keep upcoming appt for future refills. Thank you 90 tablet 0  . calcium carbonate (OS-CAL) 600 MG tablet Take 600 mg by mouth 2 (two) times daily with a meal.     No facility-administered medications prior to visit.      Allergies:   Ceftin [cefuroxime axetil]; Ciprofloxacin hcl; and Nitrofuran derivatives   Social History   Socioeconomic History  . Marital status: Widowed    Spouse name: Not on file  .  Number of children: Not on file  . Years of education: Not on file  . Highest education level: 10th grade  Occupational History  . Not on file  Social Needs  . Financial resource strain: Not on file  . Food insecurity:    Worry: Not on file    Inability: Not on file  . Transportation needs:    Medical: Not on file    Non-medical: Not on file  Tobacco Use  . Smoking status: Former Research scientist (life sciences)  . Smokeless tobacco: Never Used  . Tobacco comment: Quit smoking age 26   Substance and Sexual Activity  . Alcohol use: No  . Drug use: No  . Sexual activity: Not on file  Lifestyle  . Physical activity:    Days per week: Not on file    Minutes per session: Not on file  . Stress: Not on file  Relationships  . Social connections:    Talks on phone: Not on file    Gets together: Not on file    Attends religious service: Not on file    Active member of club or organization: Not on file    Attends meetings of clubs or organizations: Not on file    Relationship status: Not on file  Other Topics Concern  . Not on file  Social History Narrative  . Not on file     Family History:  The patient's family history includes Heart attack in her father; Kidney disease in her father.   ROS:   Please see the history of present illness.    Review of Systems  All other systems reviewed and are negative.     PHYSICAL EXAM:   VS:  BP 128/84   Pulse (!) 59   Ht 5\' 4"  (1.626 m)   Wt 157 lb 1.9 oz (71.3 kg)   BMI 26.97 kg/m    GEN: Well nourished, well developed, in no acute distress  HEENT: normal  Neck: no JVD, carotid bruits, or masses Cardiac: RRR; no murmurs, rubs, or gallops, trace lower extremity ankle edema  Respiratory:  clear to auscultation bilaterally, normal work of breathing GI: soft, nontender, nondistended, + BS MS: no deformity or atrophy  Skin: warm and dry, no rash Neuro:  Alert and Oriented x 3, Strength and sensation are intact Psych: euthymic mood, full affect   Wt  Readings from Last 3 Encounters:  09/26/17 157 lb 1.9 oz (71.3 kg)  09/10/16 153 lb (69.4 kg)  06/22/16 150 lb (68 kg)      Studies/Labs Reviewed:   EKG:  EKG is ordered today.  The ekg ordered today demonstrates a 09/26/17 - SB with RBBB, 09/10/16-sinus rhythm 67 with right bundle branch block, no change from prior, personally  viewed  Recent Labs: No results found for requested labs within last 8760 hours.   Lipid Panel    Component Value Date/Time   CHOL 163 07/24/2013 0913   TRIG 86.0 07/24/2013 0913   HDL 61.80 07/24/2013 0913   CHOLHDL 3 07/24/2013 0913   VLDL 17.2 07/24/2013 0913   LDLCALC 84 07/24/2013 0913    Additional studies/ records that were reviewed today include:   NUC stress: 06/22/16:  The left ventricular ejection fraction is hyperdynamic (>65%).  Nuclear stress EF: 75%.  There was no ST segment deviation noted during stress.  The study is normal.  This is a low risk study.  Cardiac catheterization 2012-nonobstructive CAD-reassuring.  Event monitor 06/22/16:  Rare premature atrial contractions. Relate to symptoms of palpitations  No atrial fibrillation or flutter or significant paroxysmal atrial tachycardia   Reassuring monitor  ASSESSMENT:    1. PAT (paroxysmal atrial tachycardia) ([place])   2. Essential hypertension   3. Aortic atherosclerosis ([place])   4. RBBB   5. Palpitations      PLAN:  In order of problems listed above:  Paroxysmal atrial tachycardia  - Event monitor 2017 showed no atrial fibrillation.  Paroxysmal atrial tachycardia, PAT noted.  - Diltiazem 180 mg once a day. No longer on atenolol because of hair loss.  -Palpitations have subsided.  Blood pressure under good control.  Right bundle branch block  - Chronic. No change.  - No syncope.  Continue to monitor.  Post knee replacement bilaterally  - Encourage exercise, activity. Minor lower extremity edema can be present in the situations.  No changes.  Nonobstructive  coronary artery disease  - Stable, no anginal symptoms. Reassurance.  Continue with secondary prevention.  Aortic atherosclerosis  - Very mild disease noted on CT of abdomen pelvis in 2015.  - Continue with hypertension control, lipid control, exercise. Diet control. Statin.  She is taking very low-dose of Crestor  Hyperlipidemia  - Continue with statin medication.  She admits that she is taking a quarter of a 5 mg pill.   Medication Adjustments/Labs and Tests Ordered: Current medicines are reviewed at length with the patient today.  Concerns regarding medicines are outlined above.  Medication changes, Labs and Tests ordered today are listed in the Patient Instructions below. Patient Instructions  Medication Instructions:  The current medical regimen is effective;  continue present plan and medications.  Follow-Up: Follow up in 1 year with Dr. Marlou Porch.  You will receive a letter in the mail 2 months before you are due.  Please call us when you receive this letter to schedule your follow up appointment.  If you need a refill on your cardiac medications before your next appointment, please call your pharmacy.  Thank you for choosing [facility]!!        Signed, Candee Furbish, MD  09/26/2017 2:29 PM    [facility] [address], [place], [place]  [address] Phone: [phone]; Fax: [phone]

## 2017-10-19 DIAGNOSIS — L718 Other rosacea: Secondary | ICD-10-CM | POA: Diagnosis not present

## 2017-10-19 DIAGNOSIS — L821 Other seborrheic keratosis: Secondary | ICD-10-CM | POA: Diagnosis not present

## 2017-10-19 DIAGNOSIS — Z85828 Personal history of other malignant neoplasm of skin: Secondary | ICD-10-CM | POA: Diagnosis not present

## 2017-10-19 DIAGNOSIS — D1801 Hemangioma of skin and subcutaneous tissue: Secondary | ICD-10-CM | POA: Diagnosis not present

## 2017-10-19 DIAGNOSIS — L820 Inflamed seborrheic keratosis: Secondary | ICD-10-CM | POA: Diagnosis not present

## 2017-10-19 DIAGNOSIS — D225 Melanocytic nevi of trunk: Secondary | ICD-10-CM | POA: Diagnosis not present

## 2017-10-19 DIAGNOSIS — L814 Other melanin hyperpigmentation: Secondary | ICD-10-CM | POA: Diagnosis not present

## 2017-10-19 DIAGNOSIS — D485 Neoplasm of uncertain behavior of skin: Secondary | ICD-10-CM | POA: Diagnosis not present

## 2017-10-19 DIAGNOSIS — L570 Actinic keratosis: Secondary | ICD-10-CM | POA: Diagnosis not present

## 2017-10-28 DIAGNOSIS — I2510 Atherosclerotic heart disease of native coronary artery without angina pectoris: Secondary | ICD-10-CM | POA: Diagnosis not present

## 2017-10-28 DIAGNOSIS — I10 Essential (primary) hypertension: Secondary | ICD-10-CM | POA: Diagnosis not present

## 2017-10-28 DIAGNOSIS — F329 Major depressive disorder, single episode, unspecified: Secondary | ICD-10-CM | POA: Diagnosis not present

## 2017-10-28 DIAGNOSIS — F4321 Adjustment disorder with depressed mood: Secondary | ICD-10-CM | POA: Diagnosis not present

## 2017-10-28 DIAGNOSIS — E782 Mixed hyperlipidemia: Secondary | ICD-10-CM | POA: Diagnosis not present

## 2017-12-14 ENCOUNTER — Other Ambulatory Visit: Payer: Self-pay | Admitting: Cardiology

## 2017-12-14 DIAGNOSIS — M25521 Pain in right elbow: Secondary | ICD-10-CM | POA: Diagnosis not present

## 2017-12-19 DIAGNOSIS — F4321 Adjustment disorder with depressed mood: Secondary | ICD-10-CM | POA: Diagnosis not present

## 2017-12-19 DIAGNOSIS — I4891 Unspecified atrial fibrillation: Secondary | ICD-10-CM | POA: Diagnosis not present

## 2017-12-19 DIAGNOSIS — I2510 Atherosclerotic heart disease of native coronary artery without angina pectoris: Secondary | ICD-10-CM | POA: Diagnosis not present

## 2017-12-19 DIAGNOSIS — E782 Mixed hyperlipidemia: Secondary | ICD-10-CM | POA: Diagnosis not present

## 2017-12-19 DIAGNOSIS — F329 Major depressive disorder, single episode, unspecified: Secondary | ICD-10-CM | POA: Diagnosis not present

## 2017-12-19 DIAGNOSIS — I10 Essential (primary) hypertension: Secondary | ICD-10-CM | POA: Diagnosis not present

## 2018-01-04 DIAGNOSIS — I2510 Atherosclerotic heart disease of native coronary artery without angina pectoris: Secondary | ICD-10-CM | POA: Diagnosis not present

## 2018-01-04 DIAGNOSIS — F329 Major depressive disorder, single episode, unspecified: Secondary | ICD-10-CM | POA: Diagnosis not present

## 2018-01-04 DIAGNOSIS — E782 Mixed hyperlipidemia: Secondary | ICD-10-CM | POA: Diagnosis not present

## 2018-01-04 DIAGNOSIS — F4321 Adjustment disorder with depressed mood: Secondary | ICD-10-CM | POA: Diagnosis not present

## 2018-01-04 DIAGNOSIS — I4891 Unspecified atrial fibrillation: Secondary | ICD-10-CM | POA: Diagnosis not present

## 2018-01-04 DIAGNOSIS — I10 Essential (primary) hypertension: Secondary | ICD-10-CM | POA: Diagnosis not present

## 2018-01-16 DIAGNOSIS — M25521 Pain in right elbow: Secondary | ICD-10-CM | POA: Diagnosis not present

## 2018-02-01 DIAGNOSIS — R1084 Generalized abdominal pain: Secondary | ICD-10-CM | POA: Diagnosis not present

## 2018-02-01 DIAGNOSIS — N281 Cyst of kidney, acquired: Secondary | ICD-10-CM | POA: Diagnosis not present

## 2018-02-08 DIAGNOSIS — F4321 Adjustment disorder with depressed mood: Secondary | ICD-10-CM | POA: Diagnosis not present

## 2018-02-08 DIAGNOSIS — I480 Paroxysmal atrial fibrillation: Secondary | ICD-10-CM | POA: Diagnosis not present

## 2018-02-08 DIAGNOSIS — E782 Mixed hyperlipidemia: Secondary | ICD-10-CM | POA: Diagnosis not present

## 2018-02-08 DIAGNOSIS — I2510 Atherosclerotic heart disease of native coronary artery without angina pectoris: Secondary | ICD-10-CM | POA: Diagnosis not present

## 2018-02-08 DIAGNOSIS — I4891 Unspecified atrial fibrillation: Secondary | ICD-10-CM | POA: Diagnosis not present

## 2018-02-08 DIAGNOSIS — I10 Essential (primary) hypertension: Secondary | ICD-10-CM | POA: Diagnosis not present

## 2018-03-02 DIAGNOSIS — E559 Vitamin D deficiency, unspecified: Secondary | ICD-10-CM | POA: Diagnosis not present

## 2018-03-02 DIAGNOSIS — I480 Paroxysmal atrial fibrillation: Secondary | ICD-10-CM | POA: Diagnosis not present

## 2018-03-02 DIAGNOSIS — I2510 Atherosclerotic heart disease of native coronary artery without angina pectoris: Secondary | ICD-10-CM | POA: Diagnosis not present

## 2018-03-02 DIAGNOSIS — E538 Deficiency of other specified B group vitamins: Secondary | ICD-10-CM | POA: Diagnosis not present

## 2018-03-02 DIAGNOSIS — F4321 Adjustment disorder with depressed mood: Secondary | ICD-10-CM | POA: Diagnosis not present

## 2018-03-02 DIAGNOSIS — Z79899 Other long term (current) drug therapy: Secondary | ICD-10-CM | POA: Diagnosis not present

## 2018-03-02 DIAGNOSIS — Z0000 Encounter for general adult medical examination without abnormal findings: Secondary | ICD-10-CM | POA: Diagnosis not present

## 2018-03-02 DIAGNOSIS — R0989 Other specified symptoms and signs involving the circulatory and respiratory systems: Secondary | ICD-10-CM | POA: Diagnosis not present

## 2018-03-02 DIAGNOSIS — E782 Mixed hyperlipidemia: Secondary | ICD-10-CM | POA: Diagnosis not present

## 2018-03-02 DIAGNOSIS — G629 Polyneuropathy, unspecified: Secondary | ICD-10-CM | POA: Diagnosis not present

## 2018-03-02 DIAGNOSIS — I10 Essential (primary) hypertension: Secondary | ICD-10-CM | POA: Diagnosis not present

## 2018-03-02 DIAGNOSIS — Z23 Encounter for immunization: Secondary | ICD-10-CM | POA: Diagnosis not present

## 2018-03-22 DIAGNOSIS — R1084 Generalized abdominal pain: Secondary | ICD-10-CM | POA: Diagnosis not present

## 2018-03-22 DIAGNOSIS — N281 Cyst of kidney, acquired: Secondary | ICD-10-CM | POA: Diagnosis not present

## 2018-03-31 DIAGNOSIS — M47817 Spondylosis without myelopathy or radiculopathy, lumbosacral region: Secondary | ICD-10-CM | POA: Diagnosis not present

## 2018-03-31 DIAGNOSIS — M545 Low back pain: Secondary | ICD-10-CM | POA: Diagnosis not present

## 2018-04-14 DIAGNOSIS — M545 Low back pain: Secondary | ICD-10-CM | POA: Diagnosis not present

## 2018-05-04 DIAGNOSIS — M47817 Spondylosis without myelopathy or radiculopathy, lumbosacral region: Secondary | ICD-10-CM | POA: Diagnosis not present

## 2018-05-31 ENCOUNTER — Other Ambulatory Visit: Payer: Self-pay | Admitting: Cardiology

## 2018-06-19 ENCOUNTER — Other Ambulatory Visit: Payer: Self-pay | Admitting: Cardiology

## 2018-06-19 MED ORDER — ROSUVASTATIN CALCIUM 5 MG PO TABS: 5.0000 mg | ORAL_TABLET | Freq: Every day | ORAL | 0 refills | Status: DC

## 2018-06-19 MED ORDER — DILTIAZEM HCL ER COATED BEADS 180 MG PO CP24: ORAL_CAPSULE | ORAL | 0 refills | Status: DC

## 2018-06-19 MED ORDER — LISINOPRIL-HYDROCHLOROTHIAZIDE 20-12.5 MG PO TABS: 0.5000 | ORAL_TABLET | Freq: Two times a day (BID) | ORAL | 0 refills | Status: DC

## 2018-06-19 NOTE — Telephone Encounter (Signed)
Pt's medications were sent to pt's pharmacy as requested. Confirmation received.  

## 2018-06-26 DIAGNOSIS — I2510 Atherosclerotic heart disease of native coronary artery without angina pectoris: Secondary | ICD-10-CM | POA: Diagnosis not present

## 2018-06-26 DIAGNOSIS — I4891 Unspecified atrial fibrillation: Secondary | ICD-10-CM | POA: Diagnosis not present

## 2018-06-26 DIAGNOSIS — I10 Essential (primary) hypertension: Secondary | ICD-10-CM | POA: Diagnosis not present

## 2018-06-26 DIAGNOSIS — F4321 Adjustment disorder with depressed mood: Secondary | ICD-10-CM | POA: Diagnosis not present

## 2018-06-26 DIAGNOSIS — E782 Mixed hyperlipidemia: Secondary | ICD-10-CM | POA: Diagnosis not present

## 2018-06-26 DIAGNOSIS — I480 Paroxysmal atrial fibrillation: Secondary | ICD-10-CM | POA: Diagnosis not present

## 2018-06-26 DIAGNOSIS — F329 Major depressive disorder, single episode, unspecified: Secondary | ICD-10-CM | POA: Diagnosis not present

## 2018-07-06 DIAGNOSIS — R0981 Nasal congestion: Secondary | ICD-10-CM | POA: Diagnosis not present

## 2018-07-06 DIAGNOSIS — R05 Cough: Secondary | ICD-10-CM | POA: Diagnosis not present

## 2018-07-06 DIAGNOSIS — J0100 Acute maxillary sinusitis, unspecified: Secondary | ICD-10-CM | POA: Diagnosis not present

## 2018-08-02 DIAGNOSIS — M545 Low back pain: Secondary | ICD-10-CM | POA: Diagnosis not present

## 2018-08-02 DIAGNOSIS — K5900 Constipation, unspecified: Secondary | ICD-10-CM | POA: Diagnosis not present

## 2018-08-02 DIAGNOSIS — G4700 Insomnia, unspecified: Secondary | ICD-10-CM | POA: Diagnosis not present

## 2018-08-09 ENCOUNTER — Other Ambulatory Visit: Payer: Self-pay | Admitting: Specialist

## 2018-08-09 DIAGNOSIS — Z1231 Encounter for screening mammogram for malignant neoplasm of breast: Secondary | ICD-10-CM

## 2018-08-31 DIAGNOSIS — H903 Sensorineural hearing loss, bilateral: Secondary | ICD-10-CM | POA: Diagnosis not present

## 2018-08-31 DIAGNOSIS — H6993 Unspecified Eustachian tube disorder, bilateral: Secondary | ICD-10-CM | POA: Diagnosis not present

## 2018-09-15 DIAGNOSIS — E782 Mixed hyperlipidemia: Secondary | ICD-10-CM | POA: Diagnosis not present

## 2018-09-15 DIAGNOSIS — I2510 Atherosclerotic heart disease of native coronary artery without angina pectoris: Secondary | ICD-10-CM | POA: Diagnosis not present

## 2018-09-15 DIAGNOSIS — F4321 Adjustment disorder with depressed mood: Secondary | ICD-10-CM | POA: Diagnosis not present

## 2018-09-15 DIAGNOSIS — I4891 Unspecified atrial fibrillation: Secondary | ICD-10-CM | POA: Diagnosis not present

## 2018-09-15 DIAGNOSIS — I480 Paroxysmal atrial fibrillation: Secondary | ICD-10-CM | POA: Diagnosis not present

## 2018-09-15 DIAGNOSIS — F329 Major depressive disorder, single episode, unspecified: Secondary | ICD-10-CM | POA: Diagnosis not present

## 2018-09-15 DIAGNOSIS — I10 Essential (primary) hypertension: Secondary | ICD-10-CM | POA: Diagnosis not present

## 2018-09-18 ENCOUNTER — Ambulatory Visit: Payer: Medicare Other

## 2018-09-20 ENCOUNTER — Other Ambulatory Visit: Payer: Self-pay | Admitting: Cardiology

## 2018-10-03 ENCOUNTER — Telehealth: Payer: Self-pay

## 2018-10-03 NOTE — Telephone Encounter (Signed)
Virtual Visit Pre-Appointment Phone Call  Steps For Call:  1. Confirm consent - "In the setting of the current Covid19 crisis, you are scheduled for a (phone or video) visit with your provider on (date) at (time).  Just as we do with many in-office visits, in order for you to participate in this visit, we must obtain consent.  If you'd like, I can send this to your mychart (if signed up) or email for you to review.  Otherwise, I can obtain your verbal consent now.  All virtual visits are billed to your insurance company just like a normal visit would be.  By agreeing to a virtual visit, we'd like you to understand that the technology does not allow for your provider to perform an examination, and thus may limit your provider's ability to fully assess your condition.  Finally, though the technology is pretty good, we cannot assure that it will always work on either your or our end, and in the setting of a video visit, we may have to convert it to a phone-only visit.  In either situation, we cannot ensure that we have a secure connection.  Are you willing to proceed?"  2. Give patient instructions for WebEx download to smartphone as below if video visit  3. Advise patient to be prepared with any vital sign or heart rhythm information, their current medicines, and a piece of paper and pen handy for any instructions they may receive the day of their visit  4. Inform patient they will receive a phone call 15 minutes prior to their appointment time (may be from unknown caller ID) so they should be prepared to answer  5. Confirm that appointment type is correct in Epic appointment notes (video vs telephone)    TELEPHONE CALL NOTE  [name] has been deemed a candidate for a follow-up tele-health visit to limit community exposure during the Covid-19 pandemic. I spoke with the patient via phone to ensure availability of phone/video source, confirm preferred email & phone number, and discuss  instructions and expectations.  I reminded [name] to be prepared with any vital sign and/or heart rhythm information that could potentially be obtained via home monitoring, at the time of her visit. I reminded [name] to expect a phone call at the time of her visit if her visit.  Did the patient verbally acknowledge consent to treatment? [place], [place] 10/03/2018 3:46 PM   DOWNLOADING THE [place], go to [employer] and type in WebEx in the search bar. [place] [employer], the blue/green circle. The app is free but as with any other app downloads, their phone may require them to verify saved payment information or Apple password. The patient does NOT have to create an account.  - If Android, ask patient to go to [employer] and type in WebEx in the search bar. [place] [employer], the blue/green circle. The app is free but as with any other app downloads, their phone may require them to verify saved payment information or Android password. The patient does NOT have to create an account.   CONSENT FOR TELE-HEALTH VISIT - PLEASE REVIEW  I hereby voluntarily request, consent and authorize [facility] and its employed or contracted physicians, physician assistants, nurse practitioners or other licensed health care professionals (the Practitioner), to provide me with telemedicine health care services (the "Services") as deemed necessary by the treating  Practitioner. I acknowledge and consent to receive the Services by the Practitioner via telemedicine. I understand that the telemedicine visit will involve communicating with the Practitioner through live audiovisual communication technology and the disclosure of certain medical information by electronic transmission. I acknowledge that I have been given the opportunity to request an in-person assessment or other available alternative prior to the telemedicine  visit and am voluntarily participating in the telemedicine visit.  I understand that I have the right to withhold or withdraw my consent to the use of telemedicine in the course of my care at any time, without affecting my right to future care or treatment, and that the Practitioner or I may terminate the telemedicine visit at any time. I understand that I have the right to inspect all information obtained and/or recorded in the course of the telemedicine visit and may receive copies of available information for a reasonable fee.  I understand that some of the potential risks of receiving the Services via telemedicine include:  Marland Kitchen Delay or interruption in medical evaluation due to technological equipment failure or disruption; . Information transmitted may not be sufficient (e.g. poor resolution of images) to allow for appropriate medical decision making by the Practitioner; and/or  . In rare instances, security protocols could fail, causing a breach of personal health information.  Furthermore, I acknowledge that it is my responsibility to provide information about my medical history, conditions and care that is complete and accurate to the best of my ability. I acknowledge that Practitioner's advice, recommendations, and/or decision may be based on factors not within their control, such as incomplete or inaccurate data provided by me or distortions of diagnostic images or specimens that may result from electronic transmissions. I understand that the practice of medicine is not an exact science and that Practitioner makes no warranties or guarantees regarding treatment outcomes. I acknowledge that I will receive a copy of this consent concurrently upon execution via email to the email address I last provided but may also request a printed copy by calling the office of [place].    I understand that my insurance will be billed for this visit.   I have read or had this consent read to me. . I  understand the contents of this consent, which adequately explains the benefits and risks of the Services being provided via telemedicine.  . I have been provided ample opportunity to ask questions regarding this consent and the Services and have had my questions answered to my satisfaction. . I give my informed consent for the services to be provided through the use of telemedicine in my medical care  By participating in this telemedicine visit I agree to the above.

## 2018-10-04 DIAGNOSIS — I10 Essential (primary) hypertension: Secondary | ICD-10-CM | POA: Diagnosis not present

## 2018-10-04 DIAGNOSIS — F4321 Adjustment disorder with depressed mood: Secondary | ICD-10-CM | POA: Diagnosis not present

## 2018-10-04 DIAGNOSIS — F329 Major depressive disorder, single episode, unspecified: Secondary | ICD-10-CM | POA: Diagnosis not present

## 2018-10-04 DIAGNOSIS — I4891 Unspecified atrial fibrillation: Secondary | ICD-10-CM | POA: Diagnosis not present

## 2018-10-04 DIAGNOSIS — I2510 Atherosclerotic heart disease of native coronary artery without angina pectoris: Secondary | ICD-10-CM | POA: Diagnosis not present

## 2018-10-04 DIAGNOSIS — I480 Paroxysmal atrial fibrillation: Secondary | ICD-10-CM | POA: Diagnosis not present

## 2018-10-04 DIAGNOSIS — E782 Mixed hyperlipidemia: Secondary | ICD-10-CM | POA: Diagnosis not present

## 2018-10-09 DIAGNOSIS — R040 Epistaxis: Secondary | ICD-10-CM | POA: Diagnosis not present

## 2018-10-09 DIAGNOSIS — J300 Vasomotor rhinitis: Secondary | ICD-10-CM | POA: Diagnosis not present

## 2018-10-12 ENCOUNTER — Other Ambulatory Visit: Payer: Self-pay

## 2018-10-12 ENCOUNTER — Encounter: Payer: Self-pay | Admitting: Cardiology

## 2018-10-12 ENCOUNTER — Telehealth (INDEPENDENT_AMBULATORY_CARE_PROVIDER_SITE_OTHER): Payer: Medicare Other | Admitting: Cardiology

## 2018-10-12 VITALS — BP 125/62 | HR 64 | Ht 64.0 in | Wt 152.0 lb

## 2018-10-12 DIAGNOSIS — I4510 Unspecified right bundle-branch block: Secondary | ICD-10-CM

## 2018-10-12 DIAGNOSIS — I700 Atherosclerosis of aorta: Secondary | ICD-10-CM

## 2018-10-12 DIAGNOSIS — I471 Supraventricular tachycardia: Secondary | ICD-10-CM

## 2018-10-12 DIAGNOSIS — I10 Essential (primary) hypertension: Secondary | ICD-10-CM

## 2018-10-12 DIAGNOSIS — R002 Palpitations: Secondary | ICD-10-CM

## 2018-10-12 NOTE — Patient Instructions (Signed)
  Medication Instructions:  The current medical regimen is effective;  continue present plan and medications.  If you need a refill on your cardiac medications before your next appointment, please call your pharmacy.   Follow-Up: Follow up in 1 year with Dr. Skains.  You will receive a letter in the mail 2 months before you are due.  Please call us when you receive this letter to schedule your follow up appointment.  Thank you for choosing [facility]!!     

## 2018-10-12 NOTE — Progress Notes (Signed)
Virtual Visit via Telephone Note   This visit type was conducted due to national recommendations for restrictions regarding the COVID-19 Pandemic (e.g. social distancing) in an effort to limit this patient's exposure and mitigate transmission in our community.  Due to her co-morbid illnesses, this patient is at least at moderate risk for complications without adequate follow up.  This format is felt to be most appropriate for this patient at this time.  The patient did not have access to video technology/had technical difficulties with video requiring transitioning to audio format only (telephone).  All issues noted in this document were discussed and addressed.  No physical exam could be performed with this format.  Please refer to the patient's chart for her  consent to telehealth for [facility].   Evaluation Performed:  Follow-up visit  Date:  10/12/2018   ID:  [name], DOB April 11, 1936, MRN [id number]  Patient Location: Home Provider Location: Home  PCP:  Josetta Huddle, MD  Cardiologist:  Candee Furbish, MD Electrophysiologist:  None   Chief Complaint: Follow-up paroxysmal atrial tachycardia  History of Present Illness:    [name] is a 83 y.o. female with paroxysmal atrial tachycardia, nonobstructive coronary artery disease, right bundle branch block hyperlipidemia hypertension and minimal carotid artery plaque here for follow-up.  At prior visit she was describing feeling weak and dizzy with severe chest pain going to her neck while shopping.  She underwent a stress test that was reassuring.  She also had transition to diltiazem because of hair falling out with atenolol in the past.  Back at last visit in April 2019 she had some chest tightness after bending over and getting up, possibly musculoskeletal.  She did take a nitroglycerin and this seemed to help.  Rare slight episodes last week.  HR 57.   The patient does not have symptoms concerning for COVID-19 infection  (fever, chills, cough, or new shortness of breath).    Past Medical History:  Diagnosis Date  . CAD (coronary artery disease)   . Chest pain   . Colon polyp   . DJD (degenerative joint disease)   . GERD (gastroesophageal reflux disease)   . Hypercholesteremia   . Hypertension   . IBS (irritable bowel syndrome)   . Neck pain   . Occlusion and stenosis of carotid artery without mention of cerebral infarction   . Palpitation   . Paresthesia   . PVD (peripheral vascular disease) ([place])   . RBBB   . Situational depression   . Varicose veins    bilateral legs   Past Surgical History:  Procedure Laterality Date  . APPENDECTOMY    . BREAST BIOPSY    . BREAST EXCISIONAL BIOPSY Bilateral 1995  . KNEE ARTHROSCOPY Right 01/02/2016   Procedure: RIGHT ARTHROSCOPY KNEE PARTIAL MEDIAL AND LATERAL MENISCECTOMY AND DEBRIDEMENT;  Surgeon: Susa Day, MD;  Location: [facility];  Service: Orthopedics;  Laterality: Right;  . LAPAROSCOPIC CHOLECYSTECTOMY     2011   . ROTATOR CUFF REPAIR Right   . TONSILLECTOMY    . TUBAL LIGATION Bilateral      Current Meds  Medication Sig  . Calcium Carbonate-Vitamin D3 (CALCIUM 600/VITAMIN D) 600-400 MG-UNIT TABS Take 1 capsule by mouth daily.  Marland Kitchen diltiazem (CARTIA XT) 180 MG 24 hr capsule Take 1 capsule by mouth once daily  . docusate sodium (COLACE) 100 MG capsule Take 100-200 mg by mouth daily as needed for mild constipation or moderate constipation.  . lansoprazole (PREVACID) 15 MG  capsule Take 15 mg by mouth every other day.  . lisinopril-hydrochlorothiazide (PRINZIDE,ZESTORETIC) 20-12.5 MG tablet Take 1/2 (one-half) tablet by mouth twice daily (Patient taking differently: Take 1 tablet by mouth daily. )  . nitroGLYCERIN (NITROSTAT) 0.4 MG SL tablet Place 1 tablet (0.4 mg total) under the tongue every 5 (five) minutes as needed for chest pain.  . Omega-3 Fatty Acids (FISH OIL PO) Take 1 capsule by mouth 2 (two) times daily.   . psyllium (METAMUCIL) 58.6 %  powder Take 1 packet by mouth daily.  . rosuvastatin (CRESTOR) 5 MG tablet Take 1 tablet (5 mg total) by mouth daily. Please keep upcoming appt for future refills. Thank you (Patient taking differently: Take 5 mg by mouth daily. Pt take 1 tablet twice a week. Monday and Friday.)     Allergies:   Ceftin [cefuroxime axetil]; Ciprofloxacin hcl; and Nitrofuran derivatives   Social History   Tobacco Use  . Smoking status: Former Research scientist (life sciences)  . Smokeless tobacco: Never Used  . Tobacco comment: Quit smoking age 68   Substance Use Topics  . Alcohol use: No  . Drug use: No     Family Hx: The patient's family history includes Heart attack in her father; Kidney disease in her father.  ROS:   Please see the history of present illness.    Denies any fevers chills nausea vomiting syncope bleeding All other systems reviewed and are negative.   Prior CV studies:   The following studies were reviewed today:  NUC stress: 06/22/16:  The left ventricular ejection fraction is hyperdynamic (>65%).  Nuclear stress EF: 75%.  There was no ST segment deviation noted during stress.  The study is normal.  This is a low risk study.  Cardiac catheterization 2012-nonobstructive CAD-reassuring.  Event monitor 06/22/16:  Rare premature atrial contractions. Relate to symptoms of palpitations  No atrial fibrillation or flutter or significant paroxysmal atrial tachycardia  Reassuring monitor  Labs/Other Tests and Data Reviewed:    EKG:  An ECG dated 10/11/17 was personally reviewed today and demonstrated:  SB 58 RBBB  Recent Labs: No results found for requested labs within last 8760 hours.   Recent Lipid Panel Lab Results  Component Value Date/Time   CHOL 163 07/24/2013 09:13 AM   TRIG 86.0 07/24/2013 09:13 AM   HDL 61.80 07/24/2013 09:13 AM   CHOLHDL 3 07/24/2013 09:13 AM   LDLCALC 84 07/24/2013 09:13 AM    Wt Readings from Last 3 Encounters:  10/12/18 152 lb (68.9 kg)  09/26/17 157  lb 1.9 oz (71.3 kg)  09/10/16 153 lb (69.4 kg)     Objective:    Vital Signs:  BP 125/62   Pulse 64   Ht 5\' 4"  (1.626 m)   Wt 152 lb (68.9 kg)   BMI 26.09 kg/m    Able to speak in full sentences without difficulty.  Alert and oriented.  ASSESSMENT & PLAN:    Paroxysmal atrial tachycardia -Prior event monitor in 2017 showed some brief episodes of atrial tachycardia.  No atrial fibrillation noted. -Continue with diltiazem 180 once a day.  No atenolol because of hair loss.  She still believes that this medication has really helped her with her palpitations.  Excellent.  Right bundle branch block -No syncope, no high risk symptoms, chronic.  Aortic atherosclerosis - Mild disease noted on 2015 CT of abdomen pelvis personally reviewed.  Continue to take statin.  Exercise, hypertension control.  Hyperlipidemia - Very low-dose statin has been utilized.  She is  taking her Crestor 5 mg twice a week.   COVID-19 Education: The signs and symptoms of COVID-19 were discussed with the patient and how to seek care for testing (follow up with PCP or arrange E-visit).  The importance of social distancing was discussed today.  Time:   Today, I have spent 11 minutes with the patient with telehealth technology discussing the above problems.     Medication Adjustments/Labs and Tests Ordered: Current medicines are reviewed at length with the patient today.  Concerns regarding medicines are outlined above.   Tests Ordered: No orders of the defined types were placed in this encounter.   Medication Changes: No orders of the defined types were placed in this encounter.   Disposition:  Follow up in 1 year(s)  Signed, Candee Furbish, MD  10/12/2018 2:13 PM    [facility]

## 2018-10-23 ENCOUNTER — Ambulatory Visit: Payer: Medicare Other

## 2018-10-26 ENCOUNTER — Ambulatory Visit: Payer: Medicare Other

## 2018-11-29 ENCOUNTER — Other Ambulatory Visit: Payer: Self-pay

## 2018-11-29 ENCOUNTER — Ambulatory Visit
Admission: RE | Admit: 2018-11-29 | Discharge: 2018-11-29 | Disposition: A | Discharge status: Home / Self Care | Payer: Medicare Other | Source: Ambulatory Visit | Attending: Specialist | Admitting: Specialist

## 2018-11-29 DIAGNOSIS — Z1231 Encounter for screening mammogram for malignant neoplasm of breast: Secondary | ICD-10-CM

## 2018-12-20 DIAGNOSIS — I10 Essential (primary) hypertension: Secondary | ICD-10-CM | POA: Diagnosis not present

## 2018-12-20 DIAGNOSIS — F4321 Adjustment disorder with depressed mood: Secondary | ICD-10-CM | POA: Diagnosis not present

## 2018-12-20 DIAGNOSIS — I4891 Unspecified atrial fibrillation: Secondary | ICD-10-CM | POA: Diagnosis not present

## 2018-12-20 DIAGNOSIS — F329 Major depressive disorder, single episode, unspecified: Secondary | ICD-10-CM | POA: Diagnosis not present

## 2018-12-20 DIAGNOSIS — I480 Paroxysmal atrial fibrillation: Secondary | ICD-10-CM | POA: Diagnosis not present

## 2018-12-20 DIAGNOSIS — I2510 Atherosclerotic heart disease of native coronary artery without angina pectoris: Secondary | ICD-10-CM | POA: Diagnosis not present

## 2018-12-20 DIAGNOSIS — E782 Mixed hyperlipidemia: Secondary | ICD-10-CM | POA: Diagnosis not present

## 2019-01-18 DIAGNOSIS — I10 Essential (primary) hypertension: Secondary | ICD-10-CM | POA: Diagnosis not present

## 2019-01-18 DIAGNOSIS — I4891 Unspecified atrial fibrillation: Secondary | ICD-10-CM | POA: Diagnosis not present

## 2019-01-18 DIAGNOSIS — I2510 Atherosclerotic heart disease of native coronary artery without angina pectoris: Secondary | ICD-10-CM | POA: Diagnosis not present

## 2019-01-18 DIAGNOSIS — I480 Paroxysmal atrial fibrillation: Secondary | ICD-10-CM | POA: Diagnosis not present

## 2019-01-18 DIAGNOSIS — F329 Major depressive disorder, single episode, unspecified: Secondary | ICD-10-CM | POA: Diagnosis not present

## 2019-01-18 DIAGNOSIS — F4321 Adjustment disorder with depressed mood: Secondary | ICD-10-CM | POA: Diagnosis not present

## 2019-01-18 DIAGNOSIS — E782 Mixed hyperlipidemia: Secondary | ICD-10-CM | POA: Diagnosis not present

## 2019-02-12 DIAGNOSIS — R140 Abdominal distension (gaseous): Secondary | ICD-10-CM | POA: Diagnosis not present

## 2019-02-12 DIAGNOSIS — K5900 Constipation, unspecified: Secondary | ICD-10-CM | POA: Diagnosis not present

## 2019-02-12 DIAGNOSIS — R1032 Left lower quadrant pain: Secondary | ICD-10-CM | POA: Diagnosis not present

## 2019-02-18 ENCOUNTER — Emergency Department (HOSPITAL_COMMUNITY): Payer: Medicare Other

## 2019-02-18 ENCOUNTER — Encounter (HOSPITAL_COMMUNITY): Payer: Self-pay

## 2019-02-18 ENCOUNTER — Other Ambulatory Visit: Payer: Self-pay

## 2019-02-18 ENCOUNTER — Emergency Department (HOSPITAL_COMMUNITY)
Admission: EM | Admit: 2019-02-18 | Discharge: 2019-02-18 | Disposition: A | Discharge status: Home / Self Care | Payer: Medicare Other | Attending: Emergency Medicine | Admitting: Emergency Medicine

## 2019-02-18 DIAGNOSIS — Z79899 Other long term (current) drug therapy: Secondary | ICD-10-CM | POA: Diagnosis not present

## 2019-02-18 DIAGNOSIS — I2510 Atherosclerotic heart disease of native coronary artery without angina pectoris: Secondary | ICD-10-CM | POA: Diagnosis not present

## 2019-02-18 DIAGNOSIS — R072 Precordial pain: Secondary | ICD-10-CM

## 2019-02-18 DIAGNOSIS — I213 ST elevation (STEMI) myocardial infarction of unspecified site: Secondary | ICD-10-CM | POA: Diagnosis not present

## 2019-02-18 DIAGNOSIS — R079 Chest pain, unspecified: Secondary | ICD-10-CM | POA: Diagnosis not present

## 2019-02-18 DIAGNOSIS — I10 Essential (primary) hypertension: Secondary | ICD-10-CM | POA: Diagnosis not present

## 2019-02-18 DIAGNOSIS — Z87891 Personal history of nicotine dependence: Secondary | ICD-10-CM | POA: Insufficient documentation

## 2019-02-18 LAB — COMPREHENSIVE METABOLIC PANEL
ALT: 23 U/L (ref 0–44)
AST: 27 U/L (ref 15–41)
Albumin: 3.9 g/dL (ref 3.5–5.0)
Alkaline Phosphatase: 55 U/L (ref 38–126)
Anion gap: 12 (ref 5–15)
BUN: 17 mg/dL (ref 8–23)
CO2: 25 mmol/L (ref 22–32)
Calcium: 9.8 mg/dL (ref 8.9–10.3)
Chloride: 98 mmol/L (ref 98–111)
Creatinine, Ser: 0.76 mg/dL (ref 0.44–1.00)
GFR calc Af Amer: >60 mL/min (ref >60)
GFR calc non Af Amer: >60 mL/min (ref >60)
Glucose, Bld: 100 mg/dL — ABNORMAL HIGH (ref 70–99)
Potassium: 3.5 mmol/L (ref 3.5–5.1)
Sodium: 135 mmol/L (ref 135–145)
Total Bilirubin: 0.8 mg/dL (ref 0.3–1.2)
Total Protein: 6.7 g/dL (ref 6.5–8.1)

## 2019-02-18 LAB — CBC
HCT: 38.5 % (ref 36.0–46.0)
Hemoglobin: 13.6 g/dL (ref 12.0–15.0)
MCH: 30 pg (ref 26.0–34.0)
MCHC: 35.3 g/dL (ref 30.0–36.0)
MCV: 85 fL (ref 80.0–100.0)
Platelets: 241 10*3/uL (ref 150–400)
RBC: 4.53 MIL/uL (ref 3.87–5.11)
RDW: 12.1 % (ref 11.5–15.5)
WBC: 5.9 10*3/uL (ref 4.0–10.5)
nRBC: 0 % (ref 0.0–0.2)

## 2019-02-18 LAB — TROPONIN I (HIGH SENSITIVITY)
Troponin I (High Sensitivity): 6 ng/L (ref <18)
Troponin I (High Sensitivity): 8 ng/L (ref <18)

## 2019-02-18 MED ORDER — TRAMADOL HCL 50 MG PO TABS
50.0000 mg | ORAL_TABLET | Freq: Once | ORAL | Status: AC
  Administered 2019-02-18: 50 mg via ORAL
  Filled 2019-02-18: qty 1

## 2019-02-18 MED ORDER — ACETAMINOPHEN 325 MG PO TABS
650.0000 mg | ORAL_TABLET | Freq: Once | ORAL | Status: AC
  Administered 2019-02-18: 650 mg via ORAL
  Filled 2019-02-18: qty 2

## 2019-02-18 NOTE — ED Notes (Signed)
Patient verbalizes understanding of discharge instructions. Opportunity for questioning and answers were provided. Armband removed by staff, pt discharged from ED.  

## 2019-02-18 NOTE — ED Triage Notes (Signed)
[place] EMS [place] church where she had chest pain radiating to left chest jaw. Started about 45 minutes ago. Hx of afib. Pt took nitro x 1 prior to EMS arrival. 324 aspirin PTA by EMS

## 2019-02-18 NOTE — Discharge Instructions (Addendum)
It was our pleasure to provide your ER care today - we hope that you feel better.  Both of your heart level tests (troponin) are good or normal.   Follow up with your cardiologist this coming week - call office tomorrow to arrange appointment.   Return to ER right away if worse, new symptoms, persistent or recurrent chest pain, trouble breathing, weak/fainting, or other concern.   You were given pain medication in the ER - no driving for the next 6 hours.

## 2019-02-18 NOTE — ED Provider Notes (Addendum)
[facility] EMERGENCY DEPARTMENT Provider Note   CSN: [id number] Arrival date & time:        History   Chief Complaint Chief Complaint  Patient presents with  . Chest Pain    HPI [name] is a 83 y.o. female.     Patient with hx htn, presents with mid chest pain at rest today, while at church. Acute onset mid chest discomfort, moderate, non radiating, dull, not pleuritic, lasted approximately 15 minutes. No other recent cp or discomfort. No exertional cp or discomfort. No recent unusual doe or fatigue. No leg pain or swelling. No hx dvt or pe. No palpitations. Pt states at the time was also anxious and felt flushed. Denies cough or uri c/o. No fever or chills. No abd pain or nv.   The history is provided by the patient.  Chest Pain Associated symptoms: no abdominal pain, no back pain, no cough, no fever, no headache, no palpitations, no shortness of breath and no vomiting     Past Medical History:  Diagnosis Date  . CAD (coronary artery disease)   . Chest pain   . Colon polyp   . DJD (degenerative joint disease)   . GERD (gastroesophageal reflux disease)   . Hypercholesteremia   . Hypertension   . IBS (irritable bowel syndrome)   . Neck pain   . Occlusion and stenosis of carotid artery without mention of cerebral infarction   . Palpitation   . Paresthesia   . PVD (peripheral vascular disease) ([place])   . RBBB   . Situational depression   . Varicose veins    bilateral legs    Patient Active Problem List   Diagnosis Date Noted  . Aortic atherosclerosis ([place]) 09/10/2016  . PSVT (paroxysmal supraventricular tachycardia) ([place]) 07/26/2013  . PAT (paroxysmal atrial tachycardia) ([place]) 07/26/2013  . Hair loss 07/26/2013  . Pure hypercholesterolemia 07/26/2013  . Hypertension   . Hypercholesteremia   . Colon polyp   . Occlusion and stenosis of carotid artery without mention of cerebral infarction   . CAD (coronary artery disease)   . Chest  pain   . GERD (gastroesophageal reflux disease)   . IBS (irritable bowel syndrome)   . Paresthesia   . Neck pain   . Palpitation   . RBBB   . Situational depression   . DJD (degenerative joint disease)   . PVD (peripheral vascular disease) ([place])     Past Surgical History:  Procedure Laterality Date  . APPENDECTOMY    . BREAST BIOPSY    . BREAST EXCISIONAL BIOPSY Bilateral 1995  . KNEE ARTHROSCOPY Right 01/02/2016   Procedure: RIGHT ARTHROSCOPY KNEE PARTIAL MEDIAL AND LATERAL MENISCECTOMY AND DEBRIDEMENT;  Surgeon: Susa Day, MD;  Location: [facility];  Service: Orthopedics;  Laterality: Right;  . LAPAROSCOPIC CHOLECYSTECTOMY     2011   . ROTATOR CUFF REPAIR Right   . TONSILLECTOMY    . TUBAL LIGATION Bilateral      OB History   No obstetric history on file.      Home Medications    Prior to Admission medications   Medication Sig Start Date End Date Taking? Authorizing Provider  Calcium Carbonate-Vitamin D3 (CALCIUM 600/VITAMIN D) 600-400 MG-UNIT TABS Take 1 capsule by mouth daily.    [provider]  diltiazem (CARTIA XT) 180 MG 24 hr capsule Take 1 capsule by mouth once daily 09/20/18   Jerline Pain, MD  docusate sodium (COLACE) 100 MG capsule Take 100-200 mg  by mouth daily as needed for mild constipation or moderate constipation.    [provider]  lansoprazole (PREVACID) 15 MG capsule Take 15 mg by mouth every other day.    [provider]  lisinopril-hydrochlorothiazide (PRINZIDE,ZESTORETIC) 20-12.5 MG tablet Take 1/2 (one-half) tablet by mouth twice daily Patient taking differently: Take 1 tablet by mouth daily.  09/20/18   Jerline Pain, MD  nitroGLYCERIN (NITROSTAT) 0.4 MG SL tablet Place 1 tablet (0.4 mg total) under the tongue every 5 (five) minutes as needed for chest pain. 03/27/15   Jerline Pain, MD  Omega-3 Fatty Acids (FISH OIL PO) Take 1 capsule by mouth 2 (two) times daily.     [provider]  psyllium (METAMUCIL) 58.6  % powder Take 1 packet by mouth daily.    [provider]  rosuvastatin (CRESTOR) 5 MG tablet Take 1 tablet (5 mg total) by mouth daily. Please keep upcoming appt for future refills. Thank you Patient taking differently: Take 5 mg by mouth daily. Pt take 1 tablet twice a week. Monday and Friday. 06/19/18   Jerline Pain, MD    Family History Family History  Problem Relation Age of Onset  . Heart attack Father   . Kidney disease Father     Social History Social History   Tobacco Use  . Smoking status: Former Research scientist (life sciences)  . Smokeless tobacco: Never Used  . Tobacco comment: Quit smoking age 53   Substance Use Topics  . Alcohol use: No  . Drug use: No     Allergies   Ceftin [cefuroxime axetil], Ciprofloxacin hcl, and Nitrofuran derivatives   Review of Systems Review of Systems  Constitutional: Negative for chills and fever.  HENT: Negative for sore throat.   Eyes: Negative for redness.  Respiratory: Negative for cough and shortness of breath.   Cardiovascular: Positive for chest pain. Negative for palpitations and leg swelling.  Gastrointestinal: Negative for abdominal pain and vomiting.  Genitourinary: Negative for flank pain.  Musculoskeletal: Negative for back pain and neck pain.  Skin: Negative for rash.  Neurological: Negative for headaches.  Hematological: Does not bruise/bleed easily.  Psychiatric/Behavioral: Negative for confusion.     Physical Exam Updated Vital Signs BP (!) 174/72 (BP Location: Right Arm)   Pulse (!) 56   Temp 98.1 F (36.7 C) (Oral)   Resp 15   SpO2 97%   Physical Exam Vitals signs and nursing note reviewed.  Constitutional:      Appearance: Normal appearance. She is well-developed.  HENT:     Head: Atraumatic.     Nose: Nose normal.     Mouth/Throat:     Mouth: Mucous membranes are moist.  Eyes:     General: No scleral icterus.    Conjunctiva/sclera: Conjunctivae normal.  Neck:     Musculoskeletal: Normal range of  motion and neck supple. No neck rigidity or muscular tenderness.     Trachea: No tracheal deviation.  Cardiovascular:     Rate and Rhythm: Normal rate and regular rhythm.     Pulses: Normal pulses.     Heart sounds: Normal heart sounds. No murmur. No friction rub. No gallop.   Pulmonary:     Effort: Pulmonary effort is normal. No respiratory distress.     Breath sounds: Normal breath sounds.  Chest:     Chest wall: No tenderness.  Abdominal:     General: Bowel sounds are normal. There is no distension.     Palpations: Abdomen is soft.  Tenderness: There is no abdominal tenderness. There is no guarding.  Genitourinary:    Comments: No cva tenderness.  Musculoskeletal:        General: No swelling or tenderness.     Right lower leg: No edema.     Left lower leg: No edema.  Skin:    General: Skin is warm and dry.     Findings: No rash.  Neurological:     Mental Status: She is alert.     Comments: Alert, speech normal. Motor/sens grossly intact bil.   Psychiatric:        Mood and Affect: Mood normal.      ED Treatments / Results  Labs (all labs ordered are listed, but only abnormal results are displayed) Results for orders placed or performed during the hospital encounter of 02/18/19  CBC  Result Value Ref Range   WBC 5.9 4.0 - 10.5 K/uL   RBC 4.53 3.87 - 5.11 MIL/uL   Hemoglobin 13.6 12.0 - 15.0 g/dL   HCT 38.5 36.0 - 46.0 %   MCV 85.0 80.0 - 100.0 fL   MCH 30.0 26.0 - 34.0 pg   MCHC 35.3 30.0 - 36.0 g/dL   RDW 12.1 11.5 - 15.5 %   Platelets 241 150 - 400 K/uL   nRBC 0.0 0.0 - 0.2 %  Comprehensive metabolic panel  Result Value Ref Range   Sodium 135 135 - 145 mmol/L   Potassium 3.5 3.5 - 5.1 mmol/L   Chloride 98 98 - 111 mmol/L   CO2 25 22 - 32 mmol/L   Glucose, Bld 100 (H) 70 - 99 mg/dL   BUN 17 8 - 23 mg/dL   Creatinine, Ser 0.76 0.44 - 1.00 mg/dL   Calcium 9.8 8.9 - 10.3 mg/dL   Total Protein 6.7 6.5 - 8.1 g/dL   Albumin 3.9 3.5 - 5.0 g/dL   AST 27 15 -  41 U/L   ALT 23 0 - 44 U/L   Alkaline Phosphatase 55 38 - 126 U/L   Total Bilirubin 0.8 0.3 - 1.2 mg/dL   GFR calc non Af Amer >60 >60 mL/min   GFR calc Af Amer >60 >60 mL/min   Anion gap 12 5 - 15  Troponin I (High Sensitivity)  Result Value Ref Range   Troponin I (High Sensitivity) 6 <18 ng/L   Dg Chest Port 1 View  Result Date: 02/18/2019 CLINICAL DATA:  Chest pain EXAM: PORTABLE CHEST 1 VIEW COMPARISON:  None. FINDINGS: Lungs are clear.  No pleural effusion or pneumothorax. The heart is normal in size. Postsurgical changes involving the right shoulder. IMPRESSION: No evidence of acute cardiopulmonary disease. Electronically Signed   By: Julian Hy M.D.   On: 02/18/2019 13:55    EKG EKG Interpretation  Date/Time:  Sunday February 18 2019 12:47:44 EDT Ventricular Rate:  52 PR Interval:    QRS Duration: 156 QT Interval:  466 QTC Calculation: 434 R Axis:   86 Text Interpretation:  Sinus rhythm Right bundle branch block No significant change since last tracing Confirmed by Lajean Saver [phone]) on 02/18/2019 3:08:44 PM   Radiology Dg Chest Port 1 View  Result Date: 02/18/2019 CLINICAL DATA:  Chest pain EXAM: PORTABLE CHEST 1 VIEW COMPARISON:  None. FINDINGS: Lungs are clear.  No pleural effusion or pneumothorax. The heart is normal in size. Postsurgical changes involving the right shoulder. IMPRESSION: No evidence of acute cardiopulmonary disease. Electronically Signed   By: Julian Hy M.D.   On: 02/18/2019 13:55  Procedures Procedures (including critical care time)  Medications Ordered in ED Medications - No data to display   Initial Impression / Assessment and Plan / ED Course  I have reviewed the triage vital signs and the nursing notes.  Pertinent labs & imaging results that were available during my care of the patient were reviewed by me and considered in my medical decision making (see chart for details).  Iv ns. Ecg. Cxr. Stat labs.   Reviewed  nursing notes and prior charts for additional history.   Labs reviewed by me - initial trop normal.  CXR reviewed by me - no pna.   Delta trop is pending.   Acetaminophen po.  Recheck, pt comfortable, no distress.  Prior cardiac cath 2012 without significant cad.   Pt requests medication for dull frontal headache (post ntg). Ultram po.   Signed out to Dr Johnney Killian - plan that if remains stable/comfortable, and repeat troponin is normal/not increasing - d/c with outpatient cardiology f/u in the coming week.   Return precautions will be provided.     Final Clinical Impressions(s) / ED Diagnoses   Final diagnoses:  None    ED Discharge Orders    None         Lajean Saver, MD 02/18/19 [phone]

## 2019-03-04 NOTE — Progress Notes (Deleted)
Cardiology Office Note   Date:  03/04/2019   ID:  [name], DOB [birth year], MRN [id number]  PCP:  Taylor Huddle, MD  Cardiologist: Dr. Marlou Porch, MD   No chief complaint on file.   History of Present Illness: [name] is a 83 y.o. female with paroxysmal atrial tachycardia, nonobstructive coronary artery disease, right bundle branch block, hyperlipidemia, hypertension and minimal carotid artery plaque who is here for post ED follow up for chest pain.    She has a prior hx of severe chest pain with associated dizziness and radiation to her neck. Follow up stress testing 06/22/2016 was found to be reassuring. Another episode from 09/2017 with chest tightness after bending over was thought to be musculoskeletal in nature. It was relieved with SL NTG.   Prior event monitor showed brief episodes of atrial tachycardia and no AF. She was continued on diltiazem 180mg  QD. Initially se was tried on atenolol however suffered hair loss and therefore this was switched to diltiazem   [name] was doing well until 02/18/2019 when she presented to the ED with resting chest pain described as moderate in severity without radiation. There is no exertional component. The episode lasted approximately 15 minutes in duration. She was feeling anxious and flushed at the time. EKG showed NSR with RBBB which is known. There were no acute changes nor arrhthymias. HsT 6>8 not consistent with ACS. CXR with no acute cardiopulmonary disease.   Last     1. Chest pain: -Seems very atypical as it occurred with rest and not with exertional. There was no associated symptoms. She had no palpitations.  -Would consider repeating stress testing in the setting that it has been since 2017 -Consider echo as she does not have one in Epic that I can see  -If palpitations, can do TSH -Continue statin for risk reduction  -Add BB?   2. Paroxysmal atrial tachycardia: -Controlled on diltiazem 180mg  QD   3.  Aortic atherosclerosis: -Per CT of abdomen and pelvis in 2015 -Risk factor modification with good HLD and BP control  -BP stable   4. HLD: -Last LDL, 84 in 2015>>repeat  -Continue low dose statin, Crestor 5mg  twice weekly    Past Medical History:  Diagnosis Date  . CAD (coronary artery disease)   . Chest pain   . Colon polyp   . DJD (degenerative joint disease)   . GERD (gastroesophageal reflux disease)   . Hypercholesteremia   . Hypertension   . IBS (irritable bowel syndrome)   . Neck pain   . Occlusion and stenosis of carotid artery without mention of cerebral infarction   . Palpitation   . Paresthesia   . PVD (peripheral vascular disease) ([place])   . RBBB   . Situational depression   . Varicose veins    bilateral legs    Past Surgical History:  Procedure Laterality Date  . APPENDECTOMY    . BREAST BIOPSY    . BREAST EXCISIONAL BIOPSY Bilateral 1995  . KNEE ARTHROSCOPY Right 01/02/2016   Procedure: RIGHT ARTHROSCOPY KNEE PARTIAL MEDIAL AND LATERAL MENISCECTOMY AND DEBRIDEMENT;  Surgeon: Taylor Day, MD;  Location: [facility];  Service: Orthopedics;  Laterality: Right;  . LAPAROSCOPIC CHOLECYSTECTOMY     2011   . ROTATOR CUFF REPAIR Right   . TONSILLECTOMY    . TUBAL LIGATION Bilateral      Current Outpatient Medications  Medication Sig Dispense Refill  . amoxicillin-clavulanate (AUGMENTIN) 875-125 MG tablet Take 1  tablet by mouth 2 (two) times daily. for 7 days    . Calcium Carbonate-Vitamin D3 (CALCIUM 600/VITAMIN D) 600-400 MG-UNIT TABS Take 1 capsule by mouth daily.    Marland Kitchen diltiazem (CARTIA XT) 180 MG 24 hr capsule Take 1 capsule by mouth once daily 90 capsule 2  . docusate sodium (COLACE) 100 MG capsule Take 100-200 mg by mouth daily as needed for mild constipation or moderate constipation.    . lansoprazole (PREVACID) 15 MG capsule Take 15 mg by mouth every other Gross.    . lisinopril-hydrochlorothiazide (PRINZIDE,ZESTORETIC) 20-12.5 MG tablet Take 1/2 (one-half)  tablet by mouth twice daily (Patient taking differently: Take 1 tablet by mouth daily. ) 90 tablet 2  . nitroGLYCERIN (NITROSTAT) 0.4 MG SL tablet Place 1 tablet (0.4 mg total) under the tongue every 5 (five) minutes as needed for chest pain. 25 tablet 5  . Omega-3 Fatty Acids (FISH OIL PO) Take 1 capsule by mouth 2 (two) times daily.     . psyllium (METAMUCIL) 58.6 % powder Take 1 packet by mouth daily.    . rosuvastatin (CRESTOR) 5 MG tablet Take 1 tablet (5 mg total) by mouth daily. Please keep upcoming appt for future refills. Thank you (Patient taking differently: Take 5 mg by mouth daily. Pt take 1 tablet twice a week. Monday and Friday.) 90 tablet 0   No current facility-administered medications for this visit.     Allergies:   Ceftin [cefuroxime axetil], Ciprofloxacin hcl, and Nitrofuran derivatives    Social History:  The patient  reports that she has quit smoking. She has never used smokeless tobacco. She reports that she does not drink alcohol or use drugs.   Family History:  The patient's ***family history includes Heart attack in her father; Kidney disease in her father.    ROS:  Please see the history of present illness.   Otherwise, review of systems are positive for {NONE DEFAULTED:18576::"none"}.   All other systems are reviewed and negative.    PHYSICAL EXAM: VS:  There were no vitals taken for this visit. , BMI There is no height or weight on file to calculate BMI. GEN: Well nourished, well developed, in no acute distress HEENT: normal Neck: no JVD, carotid bruits, or masses Cardiac: ***RRR; no murmurs, rubs, or gallops,no edema  Respiratory:  clear to auscultation bilaterally, normal work of breathing GI: soft, nontender, nondistended, + BS MS: no deformity or atrophy Skin: warm and dry, no rash Neuro:  Strength and sensation are intact Psych: euthymic mood, full affect   EKG:  EKG {ACTION; IS/IS [id number] ordered today. The ekg ordered today demonstrates  ***   Recent Labs: 02/18/2019: ALT 23; BUN 17; Creatinine, Ser 0.76; Hemoglobin 13.6; Platelets 241; Potassium 3.5; Sodium 135    Lipid Panel    Component Value Date/Time   CHOL 163 07/24/2013 0913   TRIG 86.0 07/24/2013 0913   HDL 61.80 07/24/2013 0913   CHOLHDL 3 07/24/2013 0913   VLDL 17.2 07/24/2013 0913   LDLCALC 84 07/24/2013 0913      Wt Readings from Last 3 Encounters:  10/12/18 152 lb (68.9 kg)  09/26/17 157 lb 1.9 oz (71.3 kg)  09/10/16 153 lb (69.4 kg)      Other studies Reviewed: Additional studies/ records that were reviewed today include:   NUC stress: 06/22/16:  The left ventricular ejection fraction is hyperdynamic (>65%).  Nuclear stress EF: 75%.  There was no ST segment deviation noted during stress.  The study is normal.  This is a low risk study.  Cardiac catheterization 2012-nonobstructive CAD-reassuring.  Event monitor 06/22/16:  Rare premature atrial contractions. Relate to symptoms of palpitations  No atrial fibrillation or flutter or significant paroxysmal atrial tachycardia  Reassuring monitor   ASSESSMENT AND PLAN:  1.  ***   Current medicines are reviewed at length with the patient today.  The patient {ACTIONS; HAS/DOES NOT HAVE:19233} concerns regarding medicines.  The following changes have been made:  {PLAN; NO CHANGE:13088:s}  Labs/ tests ordered today include: *** No orders of the defined types were placed in this encounter.    Disposition:   FU with *** in {gen number [id number] {Days to years:10300}  Signed, Kathyrn Drown, NP  03/04/2019 5:54 PM    [facility] [address], [place], [place]  [address] Phone: [phone]; Fax: [phone]

## 2019-03-08 ENCOUNTER — Ambulatory Visit: Payer: Medicare Other | Admitting: Cardiology

## 2019-03-20 ENCOUNTER — Other Ambulatory Visit: Payer: Self-pay | Admitting: Cardiology

## 2019-03-20 MED ORDER — NITROGLYCERIN 0.4 MG SL SUBL: 0.4000 mg | SUBLINGUAL_TABLET | SUBLINGUAL | 5 refills | Status: AC | PRN

## 2019-03-20 NOTE — Telephone Encounter (Signed)
Pt's medication was sent to pt's pharmacy as requested. Confirmation received.  °

## 2019-03-26 DIAGNOSIS — I2510 Atherosclerotic heart disease of native coronary artery without angina pectoris: Secondary | ICD-10-CM | POA: Diagnosis not present

## 2019-03-26 DIAGNOSIS — I480 Paroxysmal atrial fibrillation: Secondary | ICD-10-CM | POA: Diagnosis not present

## 2019-03-26 DIAGNOSIS — E538 Deficiency of other specified B group vitamins: Secondary | ICD-10-CM | POA: Diagnosis not present

## 2019-03-26 DIAGNOSIS — Z23 Encounter for immunization: Secondary | ICD-10-CM | POA: Diagnosis not present

## 2019-03-26 DIAGNOSIS — J309 Allergic rhinitis, unspecified: Secondary | ICD-10-CM | POA: Diagnosis not present

## 2019-03-26 DIAGNOSIS — R1032 Left lower quadrant pain: Secondary | ICD-10-CM | POA: Diagnosis not present

## 2019-03-26 DIAGNOSIS — K219 Gastro-esophageal reflux disease without esophagitis: Secondary | ICD-10-CM | POA: Diagnosis not present

## 2019-03-26 DIAGNOSIS — I10 Essential (primary) hypertension: Secondary | ICD-10-CM | POA: Diagnosis not present

## 2019-03-26 DIAGNOSIS — Z0000 Encounter for general adult medical examination without abnormal findings: Secondary | ICD-10-CM | POA: Diagnosis not present

## 2019-03-26 DIAGNOSIS — F322 Major depressive disorder, single episode, severe without psychotic features: Secondary | ICD-10-CM | POA: Diagnosis not present

## 2019-03-26 DIAGNOSIS — E782 Mixed hyperlipidemia: Secondary | ICD-10-CM | POA: Diagnosis not present

## 2019-03-26 DIAGNOSIS — Z1389 Encounter for screening for other disorder: Secondary | ICD-10-CM | POA: Diagnosis not present

## 2019-03-26 DIAGNOSIS — E559 Vitamin D deficiency, unspecified: Secondary | ICD-10-CM | POA: Diagnosis not present

## 2019-04-11 DIAGNOSIS — J3089 Other allergic rhinitis: Secondary | ICD-10-CM | POA: Diagnosis not present

## 2019-04-11 DIAGNOSIS — H1045 Other chronic allergic conjunctivitis: Secondary | ICD-10-CM | POA: Diagnosis not present

## 2019-04-11 DIAGNOSIS — J301 Allergic rhinitis due to pollen: Secondary | ICD-10-CM | POA: Diagnosis not present

## 2019-04-11 DIAGNOSIS — J309 Allergic rhinitis, unspecified: Secondary | ICD-10-CM | POA: Diagnosis not present

## 2019-04-20 DIAGNOSIS — G5712 Meralgia paresthetica, left lower limb: Secondary | ICD-10-CM | POA: Diagnosis not present

## 2019-04-29 ENCOUNTER — Other Ambulatory Visit: Payer: Self-pay | Admitting: Cardiology

## 2019-04-30 ENCOUNTER — Other Ambulatory Visit: Payer: Self-pay | Admitting: Cardiology

## 2019-04-30 MED ORDER — ROSUVASTATIN CALCIUM 5 MG PO TABS: 5.0000 mg | ORAL_TABLET | Freq: Every day | ORAL | 1 refills | Status: DC

## 2019-04-30 NOTE — Telephone Encounter (Signed)
Pt's medication was sent to pt's pharmacy as requested. Confirmation received.  °

## 2019-06-27 DIAGNOSIS — I10 Essential (primary) hypertension: Secondary | ICD-10-CM | POA: Diagnosis not present

## 2019-06-27 DIAGNOSIS — F4321 Adjustment disorder with depressed mood: Secondary | ICD-10-CM | POA: Diagnosis not present

## 2019-06-27 DIAGNOSIS — E782 Mixed hyperlipidemia: Secondary | ICD-10-CM | POA: Diagnosis not present

## 2019-06-27 DIAGNOSIS — I480 Paroxysmal atrial fibrillation: Secondary | ICD-10-CM | POA: Diagnosis not present

## 2019-06-27 DIAGNOSIS — I2510 Atherosclerotic heart disease of native coronary artery without angina pectoris: Secondary | ICD-10-CM | POA: Diagnosis not present

## 2019-06-27 DIAGNOSIS — I4891 Unspecified atrial fibrillation: Secondary | ICD-10-CM | POA: Diagnosis not present

## 2019-06-27 DIAGNOSIS — F329 Major depressive disorder, single episode, unspecified: Secondary | ICD-10-CM | POA: Diagnosis not present

## 2019-07-20 DIAGNOSIS — I2510 Atherosclerotic heart disease of native coronary artery without angina pectoris: Secondary | ICD-10-CM | POA: Diagnosis not present

## 2019-07-20 DIAGNOSIS — F329 Major depressive disorder, single episode, unspecified: Secondary | ICD-10-CM | POA: Diagnosis not present

## 2019-07-20 DIAGNOSIS — I480 Paroxysmal atrial fibrillation: Secondary | ICD-10-CM | POA: Diagnosis not present

## 2019-07-20 DIAGNOSIS — I4891 Unspecified atrial fibrillation: Secondary | ICD-10-CM | POA: Diagnosis not present

## 2019-07-20 DIAGNOSIS — E782 Mixed hyperlipidemia: Secondary | ICD-10-CM | POA: Diagnosis not present

## 2019-07-20 DIAGNOSIS — F4321 Adjustment disorder with depressed mood: Secondary | ICD-10-CM | POA: Diagnosis not present

## 2019-07-20 DIAGNOSIS — I10 Essential (primary) hypertension: Secondary | ICD-10-CM | POA: Diagnosis not present

## 2019-08-05 ENCOUNTER — Ambulatory Visit: Payer: Medicare Other | Attending: Internal Medicine

## 2019-08-05 DIAGNOSIS — Z23 Encounter for immunization: Secondary | ICD-10-CM

## 2019-08-05 NOTE — Progress Notes (Signed)
   Covid-19 Vaccination Clinic  Name:  [name]    MRN: [id number] DOB: June 05, 1936  08/05/2019  [name] was observed post Covid-19 immunization for 15 minutes without incidence. She was provided with Vaccine Information Sheet and instruction to access the V-Safe system.   [name] was instructed to call 911 with any severe reactions post vaccine: Marland Kitchen Difficulty breathing  . Swelling of your face and throat  . A fast heartbeat  . A bad rash all over your body  . Dizziness and weakness    Immunizations Administered    Name Date Dose VIS Date Route   Pfizer COVID-19 Vaccine 08/05/2019  4:56 PM 0.3 mL 06/08/2019 Intramuscular   Manufacturer: [place]   Lot: [id number]   [place]: [id number]

## 2019-08-23 ENCOUNTER — Other Ambulatory Visit: Payer: Self-pay

## 2019-08-24 DIAGNOSIS — F329 Major depressive disorder, single episode, unspecified: Secondary | ICD-10-CM | POA: Diagnosis not present

## 2019-08-24 DIAGNOSIS — E782 Mixed hyperlipidemia: Secondary | ICD-10-CM | POA: Diagnosis not present

## 2019-08-24 DIAGNOSIS — I4891 Unspecified atrial fibrillation: Secondary | ICD-10-CM | POA: Diagnosis not present

## 2019-08-24 DIAGNOSIS — I10 Essential (primary) hypertension: Secondary | ICD-10-CM | POA: Diagnosis not present

## 2019-08-24 DIAGNOSIS — F4321 Adjustment disorder with depressed mood: Secondary | ICD-10-CM | POA: Diagnosis not present

## 2019-08-24 DIAGNOSIS — I480 Paroxysmal atrial fibrillation: Secondary | ICD-10-CM | POA: Diagnosis not present

## 2019-08-24 DIAGNOSIS — I2510 Atherosclerotic heart disease of native coronary artery without angina pectoris: Secondary | ICD-10-CM | POA: Diagnosis not present

## 2019-08-30 ENCOUNTER — Ambulatory Visit: Payer: Medicare Other | Attending: Internal Medicine

## 2019-08-30 DIAGNOSIS — Z23 Encounter for immunization: Secondary | ICD-10-CM

## 2019-08-30 NOTE — Progress Notes (Signed)
   Covid-19 Vaccination Clinic  Name:  [name]    MRN: [id number] DOB: [birth year]  08/30/2019  [name] was observed post Covid-19 immunization for 15 minutes without incident. She was provided with Vaccine Information Sheet and instruction to access the V-Safe system.   [name] was instructed to call 911 with any severe reactions post vaccine: Marland Kitchen Difficulty breathing  . Swelling of face and throat  . A fast heartbeat  . A bad rash all over body  . Dizziness and weakness   Immunizations Administered    Name Date Dose VIS Date Route   Pfizer COVID-19 Vaccine 08/30/2019 12:54 PM 0.3 mL 06/08/2019 Intramuscular   Manufacturer: [place]   Lot: [id number]   [place]: [id number]

## 2019-09-14 ENCOUNTER — Telehealth: Payer: Self-pay | Admitting: Cardiology

## 2019-09-14 MED ORDER — DILTIAZEM HCL ER COATED BEADS 180 MG PO CP24: ORAL_CAPSULE | ORAL | 0 refills | Status: DC

## 2019-09-14 MED ORDER — LISINOPRIL-HYDROCHLOROTHIAZIDE 20-12.5 MG PO TABS: ORAL_TABLET | ORAL | 0 refills | Status: DC

## 2019-09-14 NOTE — Telephone Encounter (Signed)
 *  STAT* If patient is at the pharmacy, call can be transferred to refill team.   1. Which medications need to be refilled? (please list name of each medication and dose if known) lisinopril-hydrochlorothiazide (PRINZIDE,ZESTORETIC) 20-12.5 MG tablet, diltiazem (CARTIA XT) 180 MG 24 hr capsule  2. Which pharmacy/location (including street and city if local pharmacy) is medication to be sent to? [place], [place]  3. Do they need a 30 day or 90 day supply? 90 days

## 2019-09-14 NOTE — Telephone Encounter (Signed)
Pt's medication was sent to pt's pharmacy as requested. Confirmation received.  °

## 2019-10-12 DIAGNOSIS — M25561 Pain in right knee: Secondary | ICD-10-CM | POA: Diagnosis not present

## 2019-10-12 DIAGNOSIS — M25562 Pain in left knee: Secondary | ICD-10-CM | POA: Diagnosis not present

## 2019-10-15 DIAGNOSIS — H2513 Age-related nuclear cataract, bilateral: Secondary | ICD-10-CM | POA: Diagnosis not present

## 2019-10-16 ENCOUNTER — Ambulatory Visit (INDEPENDENT_AMBULATORY_CARE_PROVIDER_SITE_OTHER): Payer: Medicare Other | Admitting: Cardiology

## 2019-10-16 ENCOUNTER — Other Ambulatory Visit: Payer: Self-pay

## 2019-10-16 VITALS — BP 126/66 | HR 63 | Ht 64.0 in | Wt 155.0 lb

## 2019-10-16 DIAGNOSIS — I10 Essential (primary) hypertension: Secondary | ICD-10-CM

## 2019-10-16 DIAGNOSIS — R002 Palpitations: Secondary | ICD-10-CM | POA: Diagnosis not present

## 2019-10-16 DIAGNOSIS — I4510 Unspecified right bundle-branch block: Secondary | ICD-10-CM | POA: Diagnosis not present

## 2019-10-16 DIAGNOSIS — I4719 Other supraventricular tachycardia: Secondary | ICD-10-CM

## 2019-10-16 DIAGNOSIS — I471 Supraventricular tachycardia: Secondary | ICD-10-CM

## 2019-10-16 NOTE — Progress Notes (Signed)
Cardiology Office Note:    Date:  10/16/2019   ID:  [name], DOB [birth year], MRN [id number]  PCP:  Taylor Huddle, MD  Cardiologist:  Taylor Furbish, MD  Electrophysiologist:  None   Referring MD: Taylor Huddle, MD     History of Present Illness:    [name] is a 84 y.o. female here for the follow-up of paroxysmal atrial tachycardia, nonobstructive coronary artery disease right bundle branch block hypertension hyperlipidemia and minimal carotid plaque.  At a prior visit she was having severe chest pain when shopping.  Underwent a stress test that was reassuring.  She opted to transition to diltiazem because of hair issues with atenolol.  She has been having some knee pain.  [name], may have torn some ligaments.  She is having an MRI scan.  Still feeling some occasional palpitations.  Still having some mild lower extremity edema.  Past Medical History:  Diagnosis Date  . CAD (coronary artery disease)   . Chest pain   . Colon polyp   . DJD (degenerative joint disease)   . GERD (gastroesophageal reflux disease)   . Hypercholesteremia   . Hypertension   . IBS (irritable bowel syndrome)   . Neck pain   . Occlusion and stenosis of carotid artery without mention of cerebral infarction   . Palpitation   . Paresthesia   . PVD (peripheral vascular disease) ([place])   . RBBB   . Situational depression   . Varicose veins    bilateral legs    Past Surgical History:  Procedure Laterality Date  . APPENDECTOMY    . BREAST BIOPSY    . BREAST EXCISIONAL BIOPSY Bilateral 1995  . KNEE ARTHROSCOPY Right 01/02/2016   Procedure: RIGHT ARTHROSCOPY KNEE PARTIAL MEDIAL AND LATERAL MENISCECTOMY AND DEBRIDEMENT;  Surgeon: Taylor Day, MD;  Location: [facility];  Service: Orthopedics;  Laterality: Right;  . LAPAROSCOPIC CHOLECYSTECTOMY     2011   . ROTATOR CUFF REPAIR Right   . TONSILLECTOMY    . TUBAL LIGATION Bilateral     Current Medications: Current Meds  Medication Sig  .  Calcium Carbonate-Vitamin D3 (CALCIUM 600/VITAMIN D) 600-400 MG-UNIT TABS Take 1 capsule by mouth daily.  Marland Kitchen diltiazem (CARTIA XT) 180 MG 24 hr capsule Take 1 capsule by mouth once daily. Please keep upcoming appt in April for future refills. Thank you .  . lansoprazole (PREVACID) 15 MG capsule Take 15 mg by mouth every other Gross.  . lisinopril-hydrochlorothiazide (ZESTORETIC) 20-12.5 MG tablet Take 1/2 (one-half) tablet by mouth twice daily. Please keep upcoming appt in April for future refills. Thank you  . nitroGLYCERIN (NITROSTAT) 0.4 MG SL tablet Place 1 tablet (0.4 mg total) under the tongue every 5 (five) minutes as needed for chest pain.  . Omega-3 Fatty Acids (FISH OIL PO) Take 1 capsule by mouth 2 (two) times daily.   . psyllium (METAMUCIL) 58.6 % powder Take 1 packet by mouth daily.  . rosuvastatin (CRESTOR) 5 MG tablet Take 1 tablet (5 mg total) by mouth daily. (Patient taking differently: Take 1.25 mg by mouth daily. )     Allergies:   Ceftin [cefuroxime axetil], Ciprofloxacin hcl, and Nitrofuran derivatives   Social History   Socioeconomic History  . Marital status: Widowed    Spouse name: Not on file  . Number of children: Not on file  . Years of education: Not on file  . Highest education level: 10th grade  Occupational History  . Not on file  Tobacco  Use  . Smoking status: Former Smoker  . Smokeless tobacco: Never Used  . Tobacco comment: Quit smoking age 57   Substance and Sexual Activity  . Alcohol use: No  . Drug use: No  . Sexual activity: Not on file  Other Topics Concern  . Not on file  Social History Narrative  . Not on file   Social Determinants of Health   Financial Resource Strain:   . Difficulty of Paying Living Expenses:   Food Insecurity:   . Worried About Charity fundraiser in the Last Year:   . Arboriculturist in the Last Year:   Transportation Needs:   . Film/video editor (Medical):   Marland Kitchen Lack of Transportation (Non-Medical):   Physical  Activity:   . Days of Exercise per Week:   . Minutes of Exercise per Session:   Stress:   . Feeling of Stress :   Social Connections:   . Frequency of Communication with Friends and Family:   . Frequency of Social Gatherings with Friends and Family:   . Attends Religious Services:   . Active Member of Clubs or Organizations:   . Attends Archivist Meetings:   Marland Kitchen Marital Status:      Family History: The patient's family history includes Heart attack in her father; Kidney disease in her father.  ROS:   Please see the history of present illness.     All other systems reviewed and are negative.  EKGs/Labs/Other Studies Reviewed:    The following studies were reviewed today: Nuclear stress test 2017-low risk normal EF Cardiac cath 2012-nonobstructive CAD Event monitor 2017-PAT no atrial fibrillation  EKG: Prior EKG shows sinus bradycardia right bundle branch block  Recent Labs: 02/18/2019: ALT 23; BUN 17; Creatinine, Ser 0.76; Hemoglobin 13.6; Platelets 241; Potassium 3.5; Sodium 135  Recent Lipid Panel    Component Value Date/Time   CHOL 163 07/24/2013 0913   TRIG 86.0 07/24/2013 0913   HDL 61.80 07/24/2013 0913   CHOLHDL 3 07/24/2013 0913   VLDL 17.2 07/24/2013 0913   LDLCALC 84 07/24/2013 0913    Physical Exam:    VS:  BP 126/66   Pulse 63   Ht 5\' 4"  (1.626 m)   Wt 155 lb (70.3 kg)   SpO2 95%   BMI 26.61 kg/m     Wt Readings from Last 3 Encounters:  10/16/19 155 lb (70.3 kg)  10/12/18 152 lb (68.9 kg)  09/26/17 157 lb 1.9 oz (71.3 kg)     GEN:  Well nourished, well developed in no acute distress HEENT: Normal NECK: No JVD; No carotid bruits LYMPHATICS: No lymphadenopathy CARDIAC: RRR, no murmurs, rubs, gallops RESPIRATORY:  Clear to auscultation without rales, wheezing or rhonchi  ABDOMEN: Soft, non-tender, non-distended MUSCULOSKELETAL: Normal lower extremity edema; No deformity  SKIN: Warm and dry NEUROLOGIC:  Alert and oriented x  3 PSYCHIATRIC:  Normal affect   ASSESSMENT:    1. PAT (paroxysmal atrial tachycardia) ([place])   2. Essential hypertension   3. RBBB   4. Palpitations    PLAN:    In order of problems listed above:  Paroxysmal atrial tachycardia/palpitations -Elucidated by event monitor in 2017.  Thankfully, no atrial fibrillation. -Continue with diltiazem.  Heart rate currently 63.  Sometimes she notices it in the 27s.  Should be okay.  No atenolol because of hair loss.  Nonobstructive coronary artery disease -Continue with secondary risk factor prevention.  Cardiac catheterization 2012.  Overall reassuring.  Right  bundle branch block -No changes made no syncope.  Aortic atherosclerosis -Very mild noted on CT scan of abdomen pelvis.  Statin use.  Hyperlipidemia -Currently taking very low-dose statin.  LDL 78.  Not too bad.  OA of knees. Fell 3 weeks ago, may have torn.    Medication Adjustments/Labs and Tests Ordered: Current medicines are reviewed at length with the patient today.  Concerns regarding medicines are outlined above.  No orders of the defined types were placed in this encounter.  No orders of the defined types were placed in this encounter.   There are no Patient Instructions on file for this visit.   Signed, Taylor Furbish, MD  10/16/2019 3:42 PM    [place]

## 2019-10-16 NOTE — Patient Instructions (Signed)
Medication Instructions:  The current medical regimen is effective;  continue present plan and medications.  *If you need a refill on your cardiac medications before your next appointment, please call your pharmacy*  Follow-Up: At [facility], you and your health needs are our priority.  As part of our continuing mission to provide you with exceptional heart care, we have created designated Provider Care Teams.  These Care Teams include your primary Cardiologist (physician) and Advanced Practice Providers (APPs -  Physician Assistants and Nurse Practitioners) who all work together to provide you with the care you need, when you need it.  We recommend signing up for the patient portal called "MyChart".  Sign up information is provided on this After Visit Summary.  MyChart is used to connect with patients for Virtual Visits (Telemedicine).  Patients are able to view lab/test results, encounter notes, upcoming appointments, etc.  Non-urgent messages can be sent to your provider as well.   To learn more about what you can do with MyChart, go to [email or web address].    Your next appointment:   12 month(s)  The format for your next appointment:   In Person  Provider:   Mark Skains, MD   Thank you for choosing [facility]!!      

## 2019-10-24 DIAGNOSIS — M25561 Pain in right knee: Secondary | ICD-10-CM | POA: Diagnosis not present

## 2019-10-31 DIAGNOSIS — M25561 Pain in right knee: Secondary | ICD-10-CM | POA: Diagnosis not present

## 2019-11-13 DIAGNOSIS — R109 Unspecified abdominal pain: Secondary | ICD-10-CM | POA: Diagnosis not present

## 2019-11-13 DIAGNOSIS — R140 Abdominal distension (gaseous): Secondary | ICD-10-CM | POA: Diagnosis not present

## 2019-11-15 DIAGNOSIS — B029 Zoster without complications: Secondary | ICD-10-CM | POA: Diagnosis not present

## 2019-11-15 DIAGNOSIS — I10 Essential (primary) hypertension: Secondary | ICD-10-CM | POA: Diagnosis not present

## 2019-11-19 DIAGNOSIS — B029 Zoster without complications: Secondary | ICD-10-CM | POA: Diagnosis not present

## 2019-11-19 DIAGNOSIS — I10 Essential (primary) hypertension: Secondary | ICD-10-CM | POA: Diagnosis not present

## 2019-11-23 DIAGNOSIS — B0233 Zoster keratitis: Secondary | ICD-10-CM | POA: Diagnosis not present

## 2019-12-04 DIAGNOSIS — B0229 Other postherpetic nervous system involvement: Secondary | ICD-10-CM | POA: Diagnosis not present

## 2019-12-04 DIAGNOSIS — B029 Zoster without complications: Secondary | ICD-10-CM | POA: Diagnosis not present

## 2019-12-11 DIAGNOSIS — R21 Rash and other nonspecific skin eruption: Secondary | ICD-10-CM | POA: Diagnosis not present

## 2019-12-18 DIAGNOSIS — I10 Essential (primary) hypertension: Secondary | ICD-10-CM | POA: Diagnosis not present

## 2019-12-18 DIAGNOSIS — I480 Paroxysmal atrial fibrillation: Secondary | ICD-10-CM | POA: Diagnosis not present

## 2019-12-18 DIAGNOSIS — I2510 Atherosclerotic heart disease of native coronary artery without angina pectoris: Secondary | ICD-10-CM | POA: Diagnosis not present

## 2019-12-18 DIAGNOSIS — I4891 Unspecified atrial fibrillation: Secondary | ICD-10-CM | POA: Diagnosis not present

## 2019-12-18 DIAGNOSIS — G4700 Insomnia, unspecified: Secondary | ICD-10-CM | POA: Diagnosis not present

## 2019-12-18 DIAGNOSIS — F329 Major depressive disorder, single episode, unspecified: Secondary | ICD-10-CM | POA: Diagnosis not present

## 2019-12-18 DIAGNOSIS — F4321 Adjustment disorder with depressed mood: Secondary | ICD-10-CM | POA: Diagnosis not present

## 2019-12-18 DIAGNOSIS — E782 Mixed hyperlipidemia: Secondary | ICD-10-CM | POA: Diagnosis not present

## 2019-12-24 ENCOUNTER — Other Ambulatory Visit: Payer: Self-pay | Admitting: Cardiology

## 2020-01-04 ENCOUNTER — Other Ambulatory Visit: Payer: Self-pay | Admitting: Specialist

## 2020-01-04 DIAGNOSIS — Z1231 Encounter for screening mammogram for malignant neoplasm of breast: Secondary | ICD-10-CM

## 2020-01-10 ENCOUNTER — Ambulatory Visit
Admission: RE | Admit: 2020-01-10 | Discharge: 2020-01-10 | Disposition: A | Discharge status: Home / Self Care | Payer: Medicare Other | Source: Ambulatory Visit | Attending: Specialist | Admitting: Specialist

## 2020-01-10 ENCOUNTER — Other Ambulatory Visit: Payer: Self-pay

## 2020-01-10 DIAGNOSIS — Z1231 Encounter for screening mammogram for malignant neoplasm of breast: Secondary | ICD-10-CM | POA: Diagnosis not present

## 2020-02-08 DIAGNOSIS — Z20828 Contact with and (suspected) exposure to other viral communicable diseases: Secondary | ICD-10-CM | POA: Diagnosis not present

## 2020-02-28 DIAGNOSIS — R1032 Left lower quadrant pain: Secondary | ICD-10-CM | POA: Diagnosis not present

## 2020-02-28 DIAGNOSIS — Z124 Encounter for screening for malignant neoplasm of cervix: Secondary | ICD-10-CM | POA: Diagnosis not present

## 2020-02-28 DIAGNOSIS — Z6827 Body mass index (BMI) 27.0-27.9, adult: Secondary | ICD-10-CM | POA: Diagnosis not present

## 2020-03-04 DIAGNOSIS — Z1211 Encounter for screening for malignant neoplasm of colon: Secondary | ICD-10-CM | POA: Diagnosis not present

## 2020-03-06 DIAGNOSIS — L570 Actinic keratosis: Secondary | ICD-10-CM | POA: Diagnosis not present

## 2020-03-06 DIAGNOSIS — B0089 Other herpesviral infection: Secondary | ICD-10-CM | POA: Diagnosis not present

## 2020-03-06 DIAGNOSIS — Z85828 Personal history of other malignant neoplasm of skin: Secondary | ICD-10-CM | POA: Diagnosis not present

## 2020-03-12 LAB — COLOGUARD: COLOGUARD: NEGATIVE

## 2020-04-03 DIAGNOSIS — I2510 Atherosclerotic heart disease of native coronary artery without angina pectoris: Secondary | ICD-10-CM | POA: Diagnosis not present

## 2020-04-03 DIAGNOSIS — I10 Essential (primary) hypertension: Secondary | ICD-10-CM | POA: Diagnosis not present

## 2020-04-03 DIAGNOSIS — R002 Palpitations: Secondary | ICD-10-CM | POA: Diagnosis not present

## 2020-04-03 DIAGNOSIS — K5900 Constipation, unspecified: Secondary | ICD-10-CM | POA: Diagnosis not present

## 2020-04-03 DIAGNOSIS — E559 Vitamin D deficiency, unspecified: Secondary | ICD-10-CM | POA: Diagnosis not present

## 2020-04-03 DIAGNOSIS — Z9889 Other specified postprocedural states: Secondary | ICD-10-CM | POA: Diagnosis not present

## 2020-04-03 DIAGNOSIS — E782 Mixed hyperlipidemia: Secondary | ICD-10-CM | POA: Diagnosis not present

## 2020-04-03 DIAGNOSIS — K219 Gastro-esophageal reflux disease without esophagitis: Secondary | ICD-10-CM | POA: Diagnosis not present

## 2020-04-03 DIAGNOSIS — Z7189 Other specified counseling: Secondary | ICD-10-CM | POA: Diagnosis not present

## 2020-04-03 DIAGNOSIS — M6281 Muscle weakness (generalized): Secondary | ICD-10-CM | POA: Diagnosis not present

## 2020-04-03 DIAGNOSIS — J309 Allergic rhinitis, unspecified: Secondary | ICD-10-CM | POA: Diagnosis not present

## 2020-04-03 DIAGNOSIS — Z79899 Other long term (current) drug therapy: Secondary | ICD-10-CM | POA: Diagnosis not present

## 2020-04-03 DIAGNOSIS — Z23 Encounter for immunization: Secondary | ICD-10-CM | POA: Diagnosis not present

## 2020-04-03 DIAGNOSIS — Z0000 Encounter for general adult medical examination without abnormal findings: Secondary | ICD-10-CM | POA: Diagnosis not present

## 2020-04-03 DIAGNOSIS — F329 Major depressive disorder, single episode, unspecified: Secondary | ICD-10-CM | POA: Diagnosis not present

## 2020-04-17 DIAGNOSIS — M6281 Muscle weakness (generalized): Secondary | ICD-10-CM | POA: Diagnosis not present

## 2020-04-22 DIAGNOSIS — R300 Dysuria: Secondary | ICD-10-CM | POA: Diagnosis not present

## 2020-04-24 DIAGNOSIS — I480 Paroxysmal atrial fibrillation: Secondary | ICD-10-CM | POA: Diagnosis not present

## 2020-04-24 DIAGNOSIS — F4321 Adjustment disorder with depressed mood: Secondary | ICD-10-CM | POA: Diagnosis not present

## 2020-04-24 DIAGNOSIS — I2510 Atherosclerotic heart disease of native coronary artery without angina pectoris: Secondary | ICD-10-CM | POA: Diagnosis not present

## 2020-04-24 DIAGNOSIS — E782 Mixed hyperlipidemia: Secondary | ICD-10-CM | POA: Diagnosis not present

## 2020-04-24 DIAGNOSIS — F329 Major depressive disorder, single episode, unspecified: Secondary | ICD-10-CM | POA: Diagnosis not present

## 2020-04-24 DIAGNOSIS — I4891 Unspecified atrial fibrillation: Secondary | ICD-10-CM | POA: Diagnosis not present

## 2020-04-24 DIAGNOSIS — G4700 Insomnia, unspecified: Secondary | ICD-10-CM | POA: Diagnosis not present

## 2020-04-24 DIAGNOSIS — I10 Essential (primary) hypertension: Secondary | ICD-10-CM | POA: Diagnosis not present

## 2020-04-28 DIAGNOSIS — M25561 Pain in right knee: Secondary | ICD-10-CM | POA: Diagnosis not present

## 2020-04-29 DIAGNOSIS — I10 Essential (primary) hypertension: Secondary | ICD-10-CM | POA: Diagnosis not present

## 2020-04-29 DIAGNOSIS — I480 Paroxysmal atrial fibrillation: Secondary | ICD-10-CM | POA: Diagnosis not present

## 2020-04-29 DIAGNOSIS — E782 Mixed hyperlipidemia: Secondary | ICD-10-CM | POA: Diagnosis not present

## 2020-04-29 DIAGNOSIS — G4700 Insomnia, unspecified: Secondary | ICD-10-CM | POA: Diagnosis not present

## 2020-04-29 DIAGNOSIS — F4321 Adjustment disorder with depressed mood: Secondary | ICD-10-CM | POA: Diagnosis not present

## 2020-04-29 DIAGNOSIS — F329 Major depressive disorder, single episode, unspecified: Secondary | ICD-10-CM | POA: Diagnosis not present

## 2020-04-29 DIAGNOSIS — I4891 Unspecified atrial fibrillation: Secondary | ICD-10-CM | POA: Diagnosis not present

## 2020-04-29 DIAGNOSIS — I2510 Atherosclerotic heart disease of native coronary artery without angina pectoris: Secondary | ICD-10-CM | POA: Diagnosis not present

## 2020-04-29 DIAGNOSIS — K219 Gastro-esophageal reflux disease without esophagitis: Secondary | ICD-10-CM | POA: Diagnosis not present

## 2020-06-05 ENCOUNTER — Other Ambulatory Visit: Payer: Self-pay

## 2020-06-05 MED ORDER — ROSUVASTATIN CALCIUM 5 MG PO TABS: 5.0000 mg | ORAL_TABLET | Freq: Every day | ORAL | 1 refills | Status: DC

## 2020-06-05 NOTE — Telephone Encounter (Signed)
Pt's medication was sent to pt's pharmacy as requested. Confirmation received.  °

## 2020-06-19 DIAGNOSIS — M25561 Pain in right knee: Secondary | ICD-10-CM | POA: Diagnosis not present

## 2020-06-25 DIAGNOSIS — I480 Paroxysmal atrial fibrillation: Secondary | ICD-10-CM | POA: Diagnosis not present

## 2020-06-25 DIAGNOSIS — K219 Gastro-esophageal reflux disease without esophagitis: Secondary | ICD-10-CM | POA: Diagnosis not present

## 2020-06-25 DIAGNOSIS — I10 Essential (primary) hypertension: Secondary | ICD-10-CM | POA: Diagnosis not present

## 2020-06-25 DIAGNOSIS — I4891 Unspecified atrial fibrillation: Secondary | ICD-10-CM | POA: Diagnosis not present

## 2020-06-25 DIAGNOSIS — F329 Major depressive disorder, single episode, unspecified: Secondary | ICD-10-CM | POA: Diagnosis not present

## 2020-06-25 DIAGNOSIS — I2510 Atherosclerotic heart disease of native coronary artery without angina pectoris: Secondary | ICD-10-CM | POA: Diagnosis not present

## 2020-06-25 DIAGNOSIS — F4321 Adjustment disorder with depressed mood: Secondary | ICD-10-CM | POA: Diagnosis not present

## 2020-06-25 DIAGNOSIS — G4700 Insomnia, unspecified: Secondary | ICD-10-CM | POA: Diagnosis not present

## 2020-06-25 DIAGNOSIS — E782 Mixed hyperlipidemia: Secondary | ICD-10-CM | POA: Diagnosis not present

## 2020-07-10 DIAGNOSIS — H16143 Punctate keratitis, bilateral: Secondary | ICD-10-CM | POA: Diagnosis not present

## 2020-07-30 DIAGNOSIS — F329 Major depressive disorder, single episode, unspecified: Secondary | ICD-10-CM | POA: Diagnosis not present

## 2020-07-30 DIAGNOSIS — I480 Paroxysmal atrial fibrillation: Secondary | ICD-10-CM | POA: Diagnosis not present

## 2020-07-30 DIAGNOSIS — I10 Essential (primary) hypertension: Secondary | ICD-10-CM | POA: Diagnosis not present

## 2020-07-30 DIAGNOSIS — E782 Mixed hyperlipidemia: Secondary | ICD-10-CM | POA: Diagnosis not present

## 2020-07-30 DIAGNOSIS — I2510 Atherosclerotic heart disease of native coronary artery without angina pectoris: Secondary | ICD-10-CM | POA: Diagnosis not present

## 2020-07-30 DIAGNOSIS — K219 Gastro-esophageal reflux disease without esophagitis: Secondary | ICD-10-CM | POA: Diagnosis not present

## 2020-07-30 DIAGNOSIS — I4891 Unspecified atrial fibrillation: Secondary | ICD-10-CM | POA: Diagnosis not present

## 2020-07-30 DIAGNOSIS — G4700 Insomnia, unspecified: Secondary | ICD-10-CM | POA: Diagnosis not present

## 2020-08-25 ENCOUNTER — Telehealth: Payer: Self-pay | Admitting: Cardiology

## 2020-08-25 NOTE — Telephone Encounter (Signed)
Patient calling in stating that she thinks she has been having episodes of Atrial Fibrillation. For the past couple of days she will have a few episodes a day where she feels like she can not catch her breath but it only lasts a couple of seconds. Once the episode is over she coughs and then feels better almost immediately. Patients current BP 133/77 with HR of 69. Patient denies any other symptoms such as CP, SOB, palpitations, swelling, diaphoresis, N/V.   Patient states that approximately a week ago her PCP doubled her dose of daily Lisinopril. The symptoms seemed to begin after she increased her dose.  Encouraged patient to reach out to PCP in the meantime.   Will send to Dr. Marlou Porch and RN for additional advisement.

## 2020-08-25 NOTE — Telephone Encounter (Signed)
Patient c/o Palpitations:  High priority if patient c/o lightheadedness, shortness of breath, or chest pain  1) How long have you had palpitations/irregular HR/ Afib? Are you having the symptoms now? Atrial Fib-  2) Are you currently experiencing lightheadedness, SOB or CP? Feels like she missed a breath -not at this time  3) Do you have a history of afib (atrial fibrillation) or irregular heart rhythm?  yes  4) Have you checked your BP or HR? (document readings if available): 137/63  5) Are you experiencing any other symptoms? No, feel like she can not get her breath for a second- pt wants to be seen

## 2020-08-26 NOTE — Telephone Encounter (Signed)
Pt has been scheduled for Thursday 08/28/2020 with Dr Marlou Porch.  She is aware of the appt.

## 2020-08-26 NOTE — Telephone Encounter (Signed)
Let's have her come in for evaluation, ECG. Me or APP Candee Furbish, MD

## 2020-08-27 DIAGNOSIS — M25561 Pain in right knee: Secondary | ICD-10-CM | POA: Diagnosis not present

## 2020-08-28 ENCOUNTER — Encounter: Payer: Self-pay | Admitting: Cardiology

## 2020-08-28 ENCOUNTER — Other Ambulatory Visit: Payer: Self-pay

## 2020-08-28 ENCOUNTER — Ambulatory Visit (INDEPENDENT_AMBULATORY_CARE_PROVIDER_SITE_OTHER): Payer: Medicare Other | Admitting: Cardiology

## 2020-08-28 VITALS — BP 140/60 | HR 73 | Ht 64.0 in | Wt 154.0 lb

## 2020-08-28 DIAGNOSIS — I471 Supraventricular tachycardia: Secondary | ICD-10-CM | POA: Diagnosis not present

## 2020-08-28 DIAGNOSIS — I2510 Atherosclerotic heart disease of native coronary artery without angina pectoris: Secondary | ICD-10-CM

## 2020-08-28 NOTE — Progress Notes (Signed)
Cardiology Office Note:    Date:  08/28/2020   ID:  TAWNIE EHRESMAN, DOB 07-Jan-1936, MRN [id number]  PCP:  Josetta Huddle, MD   [place]  Cardiologist:  Candee Furbish, MD  Advanced Practice Provider:  No care team member to display Electrophysiologist:  None      Referring MD: Josetta Huddle, MD     History of Present Illness:    [name] is a 85 y.o. female here for follow-up of paroxysmal atrial tachycardia, nonobstructive coronary artery disease, right bundle branch block.  Prior stress test was reassuring.  This was in the setting of severe chest pain while shopping.  Past Medical History:  Diagnosis Date  . CAD (coronary artery disease)   . Chest pain   . Colon polyp   . DJD (degenerative joint disease)   . GERD (gastroesophageal reflux disease)   . Hypercholesteremia   . Hypertension   . IBS (irritable bowel syndrome)   . Neck pain   . Occlusion and stenosis of carotid artery without mention of cerebral infarction   . Palpitation   . Paresthesia   . PVD (peripheral vascular disease) ([place])   . RBBB   . Situational depression   . Varicose veins    bilateral legs    Past Surgical History:  Procedure Laterality Date  . APPENDECTOMY    . BREAST BIOPSY    . BREAST EXCISIONAL BIOPSY Bilateral 1995  . KNEE ARTHROSCOPY Right 01/02/2016   Procedure: RIGHT ARTHROSCOPY KNEE PARTIAL MEDIAL AND LATERAL MENISCECTOMY AND DEBRIDEMENT;  Surgeon: Susa Day, MD;  Location: [facility];  Service: Orthopedics;  Laterality: Right;  . LAPAROSCOPIC CHOLECYSTECTOMY     2011   . ROTATOR CUFF REPAIR Right   . TONSILLECTOMY    . TUBAL LIGATION Bilateral     Current Medications: Current Meds  Medication Sig  . aspirin 81 MG EC tablet Take 81 mg by mouth daily.  . Calcium Carbonate-Vitamin D3 600-400 MG-UNIT TABS Take 1 capsule by mouth daily.  Marland Kitchen diltiazem (CARDIZEM CD) 180 MG 24 hr capsule TAKE 1 CAPSULE BY MOUTH ONCE DAILY *KEEP UPCOMING APPT IN APRIL  FOR FUTURE REFILLS*  . lansoprazole (PREVACID) 15 MG capsule Take 15 mg by mouth every other day.  . lisinopril-hydrochlorothiazide (ZESTORETIC) 20-25 MG tablet Take 1 tablet by mouth daily.  . nitroGLYCERIN (NITROSTAT) 0.4 MG SL tablet Place 1 tablet (0.4 mg total) under the tongue every 5 (five) minutes as needed for chest pain.  . Omega-3 Fatty Acids (FISH OIL PO) Take 1 capsule by mouth 2 (two) times daily.   . psyllium (METAMUCIL) 58.6 % powder Take 1 packet by mouth daily.  . rosuvastatin (CRESTOR) 5 MG tablet Take 1 tablet (5 mg total) by mouth daily.     Allergies:   Ceftin [cefuroxime axetil], Ciprofloxacin hcl, and Nitrofuran derivatives   Social History   Socioeconomic History  . Marital status: Widowed    Spouse name: Not on file  . Number of children: Not on file  . Years of education: Not on file  . Highest education level: 10th grade  Occupational History  . Not on file  Tobacco Use  . Smoking status: Former Research scientist (life sciences)  . Smokeless tobacco: Never Used  . Tobacco comment: Quit smoking age 89   Vaping Use  . Vaping Use: Never used  Substance and Sexual Activity  . Alcohol use: No  . Drug use: No  . Sexual activity: Not on file  Other Topics  Concern  . Not on file  Social History Narrative  . Not on file   Social Determinants of Health   Financial Resource Strain: Not on file  Food Insecurity: Not on file  Transportation Needs: Not on file  Physical Activity: Not on file  Stress: Not on file  Social Connections: Not on file     Family History: The patient's family history includes Heart attack in her father; Kidney disease in her father.  ROS:   Please see the history of present illness.     All other systems reviewed and are negative.  EKGs/Labs/Other Studies Reviewed:     EKG:  EKG is  ordered today.  The ekg ordered today demonstrates sinus rhythm 73 right bundle branch block  Recent Labs: No results found for requested labs within last 8760  hours.  Recent Lipid Panel    Component Value Date/Time   CHOL 163 07/24/2013 0913   TRIG 86.0 07/24/2013 0913   HDL 61.80 07/24/2013 0913   CHOLHDL 3 07/24/2013 0913   VLDL 17.2 07/24/2013 0913   LDLCALC 84 07/24/2013 0913     Risk Assessment/Calculations:      Physical Exam:    VS:  BP 140/60 (BP Location: Left Arm, Patient Position: Sitting, Cuff Size: Normal)   Pulse 73   Ht 5\' 4"  (1.626 m)   Wt 154 lb (69.9 kg)   SpO2 97%   BMI 26.43 kg/m     Wt Readings from Last 3 Encounters:  08/28/20 154 lb (69.9 kg)  10/16/19 155 lb (70.3 kg)  10/12/18 152 lb (68.9 kg)     GEN:  Well nourished, well developed in no acute distress HEENT: Normal NECK: No JVD; No carotid bruits LYMPHATICS: No lymphadenopathy CARDIAC: RRR, no murmurs, rubs, gallops RESPIRATORY:  Clear to auscultation without rales, wheezing or rhonchi  ABDOMEN: Soft, non-tender, non-distended MUSCULOSKELETAL:  No edema; No deformity  SKIN: Warm and dry NEUROLOGIC:  Alert and oriented x 3 PSYCHIATRIC:  Normal affect   ASSESSMENT:    1. Coronary artery disease involving native coronary artery of native heart without angina pectoris   2. PSVT (paroxysmal supraventricular tachycardia) (HCC)    PLAN:    In order of problems listed above:  Paroxysmal atrial tachycardia/palpitations -Seen on monitor in 2017.  No evidence of atrial fibrillation at that time. -Currently on diltiazem.  Doing well.  Does feel an occasional skipping sensation.  She always worries that her heart is going to stop.  I reassured her.  Nonobstructive coronary artery disease -Continue with secondary risk factor prevention.  Cardiac cath in 2012 overall was reassuring.  Aortic atherosclerosis -Very mild noted on CT scan of abdomen and pelvis personally reviewed.  Continue with statin use.  Hyperlipidemia -Taking low-dose statin, LDL 78.  Optimally less than 70.  Right bundle branch block -No changes.  No  syncope  Osteoarthritis of knees -Prior fall.  Carotid artery plaque -Mild bilateral, continue with risk factor modification   Medication Adjustments/Labs and Tests Ordered: Current medicines are reviewed at length with the patient today.  Concerns regarding medicines are outlined above.  Orders Placed This Encounter  Procedures  . EKG 12-Lead   No orders of the defined types were placed in this encounter.   Patient Instructions  Medication Instructions:  Your physician recommends that you continue on your current medications as directed. Please refer to the Current Medication list given to you today. *If you need a refill on your cardiac medications before your next appointment,  please call your pharmacy*    Follow-Up: At [facility], you and your health needs are our priority.  As part of our continuing mission to provide you with exceptional heart care, we have created designated Provider Care Teams.  These Care Teams include your primary Cardiologist (physician) and Advanced Practice Providers (APPs -  Physician Assistants and Nurse Practitioners) who all work together to provide you with the care you need, when you need it.  We recommend signing up for the patient portal called "MyChart".  Sign up information is provided on this After Visit Summary.  MyChart is used to connect with patients for Virtual Visits (Telemedicine).  Patients are able to view lab/test results, encounter notes, upcoming appointments, etc.  Non-urgent messages can be sent to your provider as well.   To learn more about what you can do with MyChart, go to [email or web address].    Your next appointment:   12 month(s)  The format for your next appointment:   In Person  Provider:   You may see Candee Furbish, MD or one of the following Advanced Practice Providers on your designated Care Team:    Kathyrn Drown, NP        Signed, Candee Furbish, MD  08/28/2020 4:14 PM    [place]

## 2020-08-28 NOTE — Patient Instructions (Signed)
Medication Instructions:  Your physician recommends that you continue on your current medications as directed. Please refer to the Current Medication list given to you today. *If you need a refill on your cardiac medications before your next appointment, please call your pharmacy*    Follow-Up: At [facility], you and your health needs are our priority.  As part of our continuing mission to provide you with exceptional heart care, we have created designated Provider Care Teams.  These Care Teams include your primary Cardiologist (physician) and Advanced Practice Providers (APPs -  Physician Assistants and Nurse Practitioners) who all work together to provide you with the care you need, when you need it.  We recommend signing up for the patient portal called "MyChart".  Sign up information is provided on this After Visit Summary.  MyChart is used to connect with patients for Virtual Visits (Telemedicine).  Patients are able to view lab/test results, encounter notes, upcoming appointments, etc.  Non-urgent messages can be sent to your provider as well.   To learn more about what you can do with MyChart, go to [email or web address].    Your next appointment:   12 month(s)  The format for your next appointment:   In Person  Provider:   You may see Candee Furbish, MD or one of the following Advanced Practice Providers on your designated Care Team:    Kathyrn Drown, NP

## 2020-09-01 DIAGNOSIS — I480 Paroxysmal atrial fibrillation: Secondary | ICD-10-CM | POA: Diagnosis not present

## 2020-09-01 DIAGNOSIS — F329 Major depressive disorder, single episode, unspecified: Secondary | ICD-10-CM | POA: Diagnosis not present

## 2020-09-01 DIAGNOSIS — I2510 Atherosclerotic heart disease of native coronary artery without angina pectoris: Secondary | ICD-10-CM | POA: Diagnosis not present

## 2020-09-01 DIAGNOSIS — E782 Mixed hyperlipidemia: Secondary | ICD-10-CM | POA: Diagnosis not present

## 2020-09-01 DIAGNOSIS — F4321 Adjustment disorder with depressed mood: Secondary | ICD-10-CM | POA: Diagnosis not present

## 2020-09-01 DIAGNOSIS — I4891 Unspecified atrial fibrillation: Secondary | ICD-10-CM | POA: Diagnosis not present

## 2020-09-01 DIAGNOSIS — K219 Gastro-esophageal reflux disease without esophagitis: Secondary | ICD-10-CM | POA: Diagnosis not present

## 2020-09-01 DIAGNOSIS — I10 Essential (primary) hypertension: Secondary | ICD-10-CM | POA: Diagnosis not present

## 2020-09-01 DIAGNOSIS — G4700 Insomnia, unspecified: Secondary | ICD-10-CM | POA: Diagnosis not present

## 2020-09-17 ENCOUNTER — Other Ambulatory Visit: Payer: Self-pay | Admitting: Cardiology

## 2020-10-01 DIAGNOSIS — M25561 Pain in right knee: Secondary | ICD-10-CM | POA: Diagnosis not present

## 2020-10-02 DIAGNOSIS — H2513 Age-related nuclear cataract, bilateral: Secondary | ICD-10-CM | POA: Diagnosis not present

## 2020-10-02 DIAGNOSIS — H04123 Dry eye syndrome of bilateral lacrimal glands: Secondary | ICD-10-CM | POA: Diagnosis not present

## 2020-10-10 DIAGNOSIS — G4700 Insomnia, unspecified: Secondary | ICD-10-CM | POA: Diagnosis not present

## 2020-10-10 DIAGNOSIS — E782 Mixed hyperlipidemia: Secondary | ICD-10-CM | POA: Diagnosis not present

## 2020-10-10 DIAGNOSIS — K219 Gastro-esophageal reflux disease without esophagitis: Secondary | ICD-10-CM | POA: Diagnosis not present

## 2020-10-10 DIAGNOSIS — F329 Major depressive disorder, single episode, unspecified: Secondary | ICD-10-CM | POA: Diagnosis not present

## 2020-10-10 DIAGNOSIS — I10 Essential (primary) hypertension: Secondary | ICD-10-CM | POA: Diagnosis not present

## 2020-10-10 DIAGNOSIS — I480 Paroxysmal atrial fibrillation: Secondary | ICD-10-CM | POA: Diagnosis not present

## 2020-10-10 DIAGNOSIS — I2510 Atherosclerotic heart disease of native coronary artery without angina pectoris: Secondary | ICD-10-CM | POA: Diagnosis not present

## 2020-10-10 DIAGNOSIS — I4891 Unspecified atrial fibrillation: Secondary | ICD-10-CM | POA: Diagnosis not present

## 2020-10-15 DIAGNOSIS — I10 Essential (primary) hypertension: Secondary | ICD-10-CM | POA: Diagnosis not present

## 2020-10-16 DIAGNOSIS — M25561 Pain in right knee: Secondary | ICD-10-CM | POA: Diagnosis not present

## 2020-10-22 DIAGNOSIS — M25561 Pain in right knee: Secondary | ICD-10-CM | POA: Diagnosis not present

## 2020-10-27 DIAGNOSIS — M25561 Pain in right knee: Secondary | ICD-10-CM | POA: Diagnosis not present

## 2020-10-30 DIAGNOSIS — I2510 Atherosclerotic heart disease of native coronary artery without angina pectoris: Secondary | ICD-10-CM | POA: Diagnosis not present

## 2020-10-30 DIAGNOSIS — I10 Essential (primary) hypertension: Secondary | ICD-10-CM | POA: Diagnosis not present

## 2020-10-30 DIAGNOSIS — I480 Paroxysmal atrial fibrillation: Secondary | ICD-10-CM | POA: Diagnosis not present

## 2020-10-30 DIAGNOSIS — G4700 Insomnia, unspecified: Secondary | ICD-10-CM | POA: Diagnosis not present

## 2020-10-30 DIAGNOSIS — F4321 Adjustment disorder with depressed mood: Secondary | ICD-10-CM | POA: Diagnosis not present

## 2020-10-30 DIAGNOSIS — E782 Mixed hyperlipidemia: Secondary | ICD-10-CM | POA: Diagnosis not present

## 2020-10-30 DIAGNOSIS — I4891 Unspecified atrial fibrillation: Secondary | ICD-10-CM | POA: Diagnosis not present

## 2020-10-30 DIAGNOSIS — F329 Major depressive disorder, single episode, unspecified: Secondary | ICD-10-CM | POA: Diagnosis not present

## 2020-10-30 DIAGNOSIS — K219 Gastro-esophageal reflux disease without esophagitis: Secondary | ICD-10-CM | POA: Diagnosis not present

## 2020-11-03 DIAGNOSIS — M25561 Pain in right knee: Secondary | ICD-10-CM | POA: Diagnosis not present

## 2020-11-13 DIAGNOSIS — M25561 Pain in right knee: Secondary | ICD-10-CM | POA: Diagnosis not present

## 2020-11-25 DIAGNOSIS — M25561 Pain in right knee: Secondary | ICD-10-CM | POA: Diagnosis not present

## 2020-12-08 DIAGNOSIS — G4700 Insomnia, unspecified: Secondary | ICD-10-CM | POA: Diagnosis not present

## 2020-12-08 DIAGNOSIS — R195 Other fecal abnormalities: Secondary | ICD-10-CM | POA: Diagnosis not present

## 2020-12-08 DIAGNOSIS — R159 Full incontinence of feces: Secondary | ICD-10-CM | POA: Diagnosis not present

## 2020-12-09 ENCOUNTER — Other Ambulatory Visit: Payer: Self-pay | Admitting: Specialist

## 2020-12-09 DIAGNOSIS — Z1231 Encounter for screening mammogram for malignant neoplasm of breast: Secondary | ICD-10-CM

## 2020-12-11 DIAGNOSIS — I480 Paroxysmal atrial fibrillation: Secondary | ICD-10-CM | POA: Diagnosis not present

## 2020-12-11 DIAGNOSIS — F329 Major depressive disorder, single episode, unspecified: Secondary | ICD-10-CM | POA: Diagnosis not present

## 2020-12-11 DIAGNOSIS — I10 Essential (primary) hypertension: Secondary | ICD-10-CM | POA: Diagnosis not present

## 2020-12-11 DIAGNOSIS — K219 Gastro-esophageal reflux disease without esophagitis: Secondary | ICD-10-CM | POA: Diagnosis not present

## 2020-12-11 DIAGNOSIS — F4321 Adjustment disorder with depressed mood: Secondary | ICD-10-CM | POA: Diagnosis not present

## 2020-12-11 DIAGNOSIS — I4891 Unspecified atrial fibrillation: Secondary | ICD-10-CM | POA: Diagnosis not present

## 2020-12-11 DIAGNOSIS — E782 Mixed hyperlipidemia: Secondary | ICD-10-CM | POA: Diagnosis not present

## 2020-12-11 DIAGNOSIS — I2510 Atherosclerotic heart disease of native coronary artery without angina pectoris: Secondary | ICD-10-CM | POA: Diagnosis not present

## 2020-12-11 DIAGNOSIS — G4700 Insomnia, unspecified: Secondary | ICD-10-CM | POA: Diagnosis not present

## 2020-12-18 DIAGNOSIS — U071 COVID-19: Secondary | ICD-10-CM | POA: Diagnosis not present

## 2021-01-02 DIAGNOSIS — Z79899 Other long term (current) drug therapy: Secondary | ICD-10-CM | POA: Diagnosis not present

## 2021-01-02 DIAGNOSIS — K219 Gastro-esophageal reflux disease without esophagitis: Secondary | ICD-10-CM | POA: Diagnosis not present

## 2021-01-02 DIAGNOSIS — R519 Headache, unspecified: Secondary | ICD-10-CM | POA: Diagnosis not present

## 2021-01-02 DIAGNOSIS — G4700 Insomnia, unspecified: Secondary | ICD-10-CM | POA: Diagnosis not present

## 2021-01-02 DIAGNOSIS — I2510 Atherosclerotic heart disease of native coronary artery without angina pectoris: Secondary | ICD-10-CM | POA: Diagnosis not present

## 2021-01-02 DIAGNOSIS — R159 Full incontinence of feces: Secondary | ICD-10-CM | POA: Diagnosis not present

## 2021-01-02 DIAGNOSIS — I10 Essential (primary) hypertension: Secondary | ICD-10-CM | POA: Diagnosis not present

## 2021-01-14 DIAGNOSIS — I480 Paroxysmal atrial fibrillation: Secondary | ICD-10-CM | POA: Diagnosis not present

## 2021-01-14 DIAGNOSIS — R42 Dizziness and giddiness: Secondary | ICD-10-CM | POA: Diagnosis not present

## 2021-01-14 DIAGNOSIS — I10 Essential (primary) hypertension: Secondary | ICD-10-CM | POA: Diagnosis not present

## 2021-01-14 DIAGNOSIS — I2510 Atherosclerotic heart disease of native coronary artery without angina pectoris: Secondary | ICD-10-CM | POA: Diagnosis not present

## 2021-01-16 ENCOUNTER — Telehealth: Payer: Self-pay | Admitting: Cardiology

## 2021-01-16 NOTE — Telephone Encounter (Signed)
Spoke with pt and has noted off and on episodes  of dizziness  along with headaches Per pt over the last month  and worse over the last 2 weeks Per pt had episode this am  fast heart rate and breathing fast and EMS was called and symptoms subsided once EMS arrived Pt has seen PCP and  all labs were fine per pt. Latest B/P from today was 144/62 Per pt lisinopril Jonita Albee was recently decreased from 20/25 to 20/12.5 mg  per PCP due to dizziness Will forward to Dr Marlou Porch for review and recommendations ./cy

## 2021-01-16 NOTE — Telephone Encounter (Signed)
STAT if patient feels like he/she is going to faint   Are you dizzy now?  No   Do you feel faint or have you passed out?  No   Do you have any other symptoms?  Headaches   Have you checked your HR and BP (record if available)? [id number]: 160/diastolic not recorded  Patient states she has been having headaches and dizzy spells on and off for a few weeks. This morning she had a dizzy spell that got so bad she states she almost blacked out. She states she called the ambulance and when they arrived she was feeling much better. Declined going to the ED. She states when they took her BP her systolic was 0000000.

## 2021-01-22 NOTE — Telephone Encounter (Signed)
Pt aware and will try and get a appt with ENT to have ears looked at Per pt will continue to monitor and if no improvement and ears are ok will call back ./cy

## 2021-01-28 DIAGNOSIS — G4700 Insomnia, unspecified: Secondary | ICD-10-CM | POA: Diagnosis not present

## 2021-01-28 DIAGNOSIS — K219 Gastro-esophageal reflux disease without esophagitis: Secondary | ICD-10-CM | POA: Diagnosis not present

## 2021-01-28 DIAGNOSIS — F329 Major depressive disorder, single episode, unspecified: Secondary | ICD-10-CM | POA: Diagnosis not present

## 2021-01-28 DIAGNOSIS — I10 Essential (primary) hypertension: Secondary | ICD-10-CM | POA: Diagnosis not present

## 2021-01-28 DIAGNOSIS — I480 Paroxysmal atrial fibrillation: Secondary | ICD-10-CM | POA: Diagnosis not present

## 2021-01-28 DIAGNOSIS — E782 Mixed hyperlipidemia: Secondary | ICD-10-CM | POA: Diagnosis not present

## 2021-01-28 DIAGNOSIS — I2510 Atherosclerotic heart disease of native coronary artery without angina pectoris: Secondary | ICD-10-CM | POA: Diagnosis not present

## 2021-01-28 DIAGNOSIS — F4321 Adjustment disorder with depressed mood: Secondary | ICD-10-CM | POA: Diagnosis not present

## 2021-01-30 ENCOUNTER — Other Ambulatory Visit: Payer: Self-pay

## 2021-01-30 ENCOUNTER — Ambulatory Visit
Admission: RE | Admit: 2021-01-30 | Discharge: 2021-01-30 | Disposition: A | Discharge status: Home / Self Care | Payer: Medicare Other | Source: Ambulatory Visit | Attending: Specialist | Admitting: Specialist

## 2021-01-30 DIAGNOSIS — Z1231 Encounter for screening mammogram for malignant neoplasm of breast: Secondary | ICD-10-CM

## 2021-02-24 DIAGNOSIS — I2510 Atherosclerotic heart disease of native coronary artery without angina pectoris: Secondary | ICD-10-CM | POA: Diagnosis not present

## 2021-02-24 DIAGNOSIS — R42 Dizziness and giddiness: Secondary | ICD-10-CM | POA: Diagnosis not present

## 2021-02-24 DIAGNOSIS — I10 Essential (primary) hypertension: Secondary | ICD-10-CM | POA: Diagnosis not present

## 2021-02-24 DIAGNOSIS — I471 Supraventricular tachycardia: Secondary | ICD-10-CM | POA: Diagnosis not present

## 2021-02-24 DIAGNOSIS — W540XXA Bitten by dog, initial encounter: Secondary | ICD-10-CM | POA: Diagnosis not present

## 2021-02-24 DIAGNOSIS — S81812D Laceration without foreign body, left lower leg, subsequent encounter: Secondary | ICD-10-CM | POA: Diagnosis not present

## 2021-03-23 DIAGNOSIS — I471 Supraventricular tachycardia: Secondary | ICD-10-CM | POA: Diagnosis not present

## 2021-03-23 DIAGNOSIS — I10 Essential (primary) hypertension: Secondary | ICD-10-CM | POA: Diagnosis not present

## 2021-03-23 DIAGNOSIS — R42 Dizziness and giddiness: Secondary | ICD-10-CM | POA: Diagnosis not present

## 2021-03-23 DIAGNOSIS — I2510 Atherosclerotic heart disease of native coronary artery without angina pectoris: Secondary | ICD-10-CM | POA: Diagnosis not present

## 2021-04-08 DIAGNOSIS — I10 Essential (primary) hypertension: Secondary | ICD-10-CM | POA: Diagnosis not present

## 2021-04-08 DIAGNOSIS — Z0000 Encounter for general adult medical examination without abnormal findings: Secondary | ICD-10-CM | POA: Diagnosis not present

## 2021-04-08 DIAGNOSIS — I480 Paroxysmal atrial fibrillation: Secondary | ICD-10-CM | POA: Diagnosis not present

## 2021-04-08 DIAGNOSIS — R0989 Other specified symptoms and signs involving the circulatory and respiratory systems: Secondary | ICD-10-CM | POA: Diagnosis not present

## 2021-04-08 DIAGNOSIS — G4700 Insomnia, unspecified: Secondary | ICD-10-CM | POA: Diagnosis not present

## 2021-04-08 DIAGNOSIS — I2510 Atherosclerotic heart disease of native coronary artery without angina pectoris: Secondary | ICD-10-CM | POA: Diagnosis not present

## 2021-04-08 DIAGNOSIS — F4321 Adjustment disorder with depressed mood: Secondary | ICD-10-CM | POA: Diagnosis not present

## 2021-04-08 DIAGNOSIS — Z1389 Encounter for screening for other disorder: Secondary | ICD-10-CM | POA: Diagnosis not present

## 2021-04-08 DIAGNOSIS — H04123 Dry eye syndrome of bilateral lacrimal glands: Secondary | ICD-10-CM | POA: Diagnosis not present

## 2021-04-08 DIAGNOSIS — Z23 Encounter for immunization: Secondary | ICD-10-CM | POA: Diagnosis not present

## 2021-04-18 DIAGNOSIS — U071 COVID-19: Secondary | ICD-10-CM | POA: Diagnosis not present

## 2021-05-07 DIAGNOSIS — L814 Other melanin hyperpigmentation: Secondary | ICD-10-CM | POA: Diagnosis not present

## 2021-05-07 DIAGNOSIS — B0089 Other herpesviral infection: Secondary | ICD-10-CM | POA: Diagnosis not present

## 2021-05-07 DIAGNOSIS — L821 Other seborrheic keratosis: Secondary | ICD-10-CM | POA: Diagnosis not present

## 2021-05-07 DIAGNOSIS — Z85828 Personal history of other malignant neoplasm of skin: Secondary | ICD-10-CM | POA: Diagnosis not present

## 2021-05-07 DIAGNOSIS — L570 Actinic keratosis: Secondary | ICD-10-CM | POA: Diagnosis not present

## 2021-05-07 DIAGNOSIS — L720 Epidermal cyst: Secondary | ICD-10-CM | POA: Diagnosis not present

## 2021-05-25 DIAGNOSIS — H6992 Unspecified Eustachian tube disorder, left ear: Secondary | ICD-10-CM | POA: Diagnosis not present

## 2021-05-25 DIAGNOSIS — H6590 Unspecified nonsuppurative otitis media, unspecified ear: Secondary | ICD-10-CM | POA: Diagnosis not present

## 2021-06-08 DIAGNOSIS — I10 Essential (primary) hypertension: Secondary | ICD-10-CM | POA: Diagnosis not present

## 2021-06-08 DIAGNOSIS — I480 Paroxysmal atrial fibrillation: Secondary | ICD-10-CM | POA: Diagnosis not present

## 2021-06-08 DIAGNOSIS — G4700 Insomnia, unspecified: Secondary | ICD-10-CM | POA: Diagnosis not present

## 2021-06-08 DIAGNOSIS — H6590 Unspecified nonsuppurative otitis media, unspecified ear: Secondary | ICD-10-CM | POA: Diagnosis not present

## 2021-06-08 DIAGNOSIS — R0989 Other specified symptoms and signs involving the circulatory and respiratory systems: Secondary | ICD-10-CM | POA: Diagnosis not present

## 2021-06-08 DIAGNOSIS — I2510 Atherosclerotic heart disease of native coronary artery without angina pectoris: Secondary | ICD-10-CM | POA: Diagnosis not present

## 2021-06-08 DIAGNOSIS — F4321 Adjustment disorder with depressed mood: Secondary | ICD-10-CM | POA: Diagnosis not present

## 2021-06-08 DIAGNOSIS — H6992 Unspecified Eustachian tube disorder, left ear: Secondary | ICD-10-CM | POA: Diagnosis not present

## 2021-07-07 DIAGNOSIS — H04123 Dry eye syndrome of bilateral lacrimal glands: Secondary | ICD-10-CM | POA: Diagnosis not present

## 2021-08-10 DIAGNOSIS — I480 Paroxysmal atrial fibrillation: Secondary | ICD-10-CM | POA: Diagnosis not present

## 2021-08-10 DIAGNOSIS — H6590 Unspecified nonsuppurative otitis media, unspecified ear: Secondary | ICD-10-CM | POA: Diagnosis not present

## 2021-08-10 DIAGNOSIS — R0989 Other specified symptoms and signs involving the circulatory and respiratory systems: Secondary | ICD-10-CM | POA: Diagnosis not present

## 2021-08-10 DIAGNOSIS — I10 Essential (primary) hypertension: Secondary | ICD-10-CM | POA: Diagnosis not present

## 2021-08-10 DIAGNOSIS — G4700 Insomnia, unspecified: Secondary | ICD-10-CM | POA: Diagnosis not present

## 2021-08-10 DIAGNOSIS — I2510 Atherosclerotic heart disease of native coronary artery without angina pectoris: Secondary | ICD-10-CM | POA: Diagnosis not present

## 2021-08-10 DIAGNOSIS — F4321 Adjustment disorder with depressed mood: Secondary | ICD-10-CM | POA: Diagnosis not present

## 2021-08-10 DIAGNOSIS — H6992 Unspecified Eustachian tube disorder, left ear: Secondary | ICD-10-CM | POA: Diagnosis not present

## 2021-09-02 DIAGNOSIS — H838X3 Other specified diseases of inner ear, bilateral: Secondary | ICD-10-CM | POA: Diagnosis not present

## 2021-09-02 DIAGNOSIS — J343 Hypertrophy of nasal turbinates: Secondary | ICD-10-CM | POA: Diagnosis not present

## 2021-09-02 DIAGNOSIS — H903 Sensorineural hearing loss, bilateral: Secondary | ICD-10-CM | POA: Diagnosis not present

## 2021-09-02 DIAGNOSIS — J342 Deviated nasal septum: Secondary | ICD-10-CM | POA: Diagnosis not present

## 2021-09-02 DIAGNOSIS — J310 Chronic rhinitis: Secondary | ICD-10-CM | POA: Diagnosis not present

## 2021-09-02 DIAGNOSIS — H6983 Other specified disorders of Eustachian tube, bilateral: Secondary | ICD-10-CM | POA: Diagnosis not present

## 2021-09-09 DIAGNOSIS — H2513 Age-related nuclear cataract, bilateral: Secondary | ICD-10-CM | POA: Diagnosis not present

## 2021-09-09 DIAGNOSIS — H5203 Hypermetropia, bilateral: Secondary | ICD-10-CM | POA: Diagnosis not present

## 2021-09-10 ENCOUNTER — Ambulatory Visit: Payer: Medicare Other | Admitting: Cardiology

## 2021-09-10 ENCOUNTER — Other Ambulatory Visit: Payer: Self-pay | Admitting: Cardiology

## 2021-09-29 ENCOUNTER — Ambulatory Visit (INDEPENDENT_AMBULATORY_CARE_PROVIDER_SITE_OTHER): Payer: Medicare Other | Admitting: Cardiology

## 2021-09-29 ENCOUNTER — Encounter (INDEPENDENT_AMBULATORY_CARE_PROVIDER_SITE_OTHER): Payer: Self-pay

## 2021-09-29 ENCOUNTER — Ambulatory Visit (INDEPENDENT_AMBULATORY_CARE_PROVIDER_SITE_OTHER): Payer: Medicare Other

## 2021-09-29 VITALS — BP 146/60 | HR 64 | Ht 64.0 in | Wt 147.4 lb

## 2021-09-29 DIAGNOSIS — R002 Palpitations: Secondary | ICD-10-CM

## 2021-09-29 DIAGNOSIS — I700 Atherosclerosis of aorta: Secondary | ICD-10-CM

## 2021-09-29 DIAGNOSIS — E7800 Pure hypercholesterolemia, unspecified: Secondary | ICD-10-CM | POA: Diagnosis not present

## 2021-09-29 DIAGNOSIS — I4510 Unspecified right bundle-branch block: Secondary | ICD-10-CM | POA: Diagnosis not present

## 2021-09-29 DIAGNOSIS — I471 Supraventricular tachycardia: Secondary | ICD-10-CM

## 2021-09-29 DIAGNOSIS — I6523 Occlusion and stenosis of bilateral carotid arteries: Secondary | ICD-10-CM

## 2021-09-29 DIAGNOSIS — M1990 Unspecified osteoarthritis, unspecified site: Secondary | ICD-10-CM | POA: Diagnosis not present

## 2021-09-29 DIAGNOSIS — I2510 Atherosclerotic heart disease of native coronary artery without angina pectoris: Secondary | ICD-10-CM

## 2021-09-29 NOTE — Progress Notes (Unsigned)
Enrolled patient for a 14 day Zio XT  monitor to be mailed to patients home  °

## 2021-09-29 NOTE — Assessment & Plan Note (Signed)
Does feel similar issues.  May be PACs.  We will check a Zio patch monitor. ?

## 2021-09-29 NOTE — Assessment & Plan Note (Signed)
Prior knee osteoarthritis with fall. ?

## 2021-09-29 NOTE — Progress Notes (Signed)
?Cardiology Office Note:   ? ?Date:  09/29/2021  ? ?ID:  [name], DOB [birth year], MRN [id number] ? ?PCP:  Josetta Huddle, MD ?  ?[place]  ?Cardiologist:  Candee Furbish, MD  ?Advanced Practice Provider:  No care team member to display ?Electrophysiologist:  None  ?   ? ?Referring MD: Josetta Huddle, MD  ? ? ? ?History of Present Illness:   ? ?[name] is a 86 y.o. female here for the follow-up of atrial fibrillation. ? ?Previously here for follow-up of paroxysmal atrial tachycardia, nonobstructive coronary artery disease, right bundle branch block. ? ?Prior stress test was reassuring.  This was in the setting of severe chest pain while shopping. ? ?Today: ?Overall, she appears well. Her main concern today is bilateral LE and foot pain. The pain is severe enough to occasionally take her breath away. She also notes having more leg swelling than usual since her diltiazem was increased. ? ?Recently she complains of frequent episodes of palpitations that she describes as "feeling like her heart skipped" or stopped. Her most recent episode was last night. She has associated lightheadedness that she knows may progress to near syncope if she does not sit down and rest. Usually she can hear her heart beat in her ears,  ? ?Several months ago her dizzy spells were severe, and could have an instantaneous onset. Lately her dizzy spells have improved. ? ?Additionally she complains of lingering left thigh pain that she believes is due to a dog bite. ? ?She denies any chest pain. No headaches, orthopnea, or PND. ? ? ?Past Medical History:  ?Diagnosis Date  ? CAD (coronary artery disease)   ? Chest pain   ? Colon polyp   ? DJD (degenerative joint disease)   ? GERD (gastroesophageal reflux disease)   ? Hypercholesteremia   ? Hypertension   ? IBS (irritable bowel syndrome)   ? Neck pain   ? Occlusion and stenosis of carotid artery without mention of cerebral infarction   ? Palpitation   ? Paresthesia    ? PVD (peripheral vascular disease) ([place])   ? RBBB   ? Situational depression   ? Varicose veins   ? bilateral legs  ? ? ?Past Surgical History:  ?Procedure Laterality Date  ? APPENDECTOMY    ? BREAST BIOPSY    ? BREAST EXCISIONAL BIOPSY Bilateral 1995  ? KNEE ARTHROSCOPY Right 01/02/2016  ? Procedure: RIGHT ARTHROSCOPY KNEE PARTIAL MEDIAL AND LATERAL MENISCECTOMY AND DEBRIDEMENT;  Surgeon: Susa Day, MD;  Location: [facility];  Service: Orthopedics;  Laterality: Right;  ? LAPAROSCOPIC CHOLECYSTECTOMY    ? 2011   ? ROTATOR CUFF REPAIR Right   ? TONSILLECTOMY    ? TUBAL LIGATION Bilateral   ? ? ?Current Medications: ?Current Meds  ?Medication Sig  ? aspirin 81 MG EC tablet Take 81 mg by mouth daily.  ? Calcium Carbonate-Vitamin D3 600-400 MG-UNIT TABS Take 1 capsule by mouth daily.  ? diltiazem (CARDIZEM CD) 180 MG 24 hr capsule Take 1 capsule by mouth once daily  ? hydrALAZINE (APRESOLINE) 25 MG tablet Take 1 tablet by mouth 3 (three) times daily with meals.  ? lansoprazole (PREVACID) 15 MG capsule Take 15 mg by mouth every other day.  ? lisinopril-hydrochlorothiazide (ZESTORETIC) 20-12.5 MG tablet Take 1 tablet by mouth daily.  ? nitroGLYCERIN (NITROSTAT) 0.4 MG SL tablet Place 1 tablet (0.4 mg total) under the tongue every 5 (five) minutes as needed for chest pain.  ? Omega-3  Fatty Acids (FISH OIL PO) Take 1 capsule by mouth 2 (two) times daily.   ? psyllium (METAMUCIL) 58.6 % powder Take 1 packet by mouth daily.  ? rosuvastatin (CRESTOR) 5 MG tablet Take 1 tablet (5 mg total) by mouth daily.  ? traZODone (DESYREL) 50 MG tablet Take 1 tablet by mouth as needed for sleep.  ? valACYclovir (VALTREX) 500 MG tablet Take 500 mg by mouth daily.  ?  ? ?Allergies:   Ceftin [cefuroxime axetil], Ciprofloxacin hcl, and Nitrofuran derivatives  ? ?Social History  ? ?Socioeconomic History  ? Marital status: Widowed  ?  Spouse name: Not on file  ? Number of children: Not on file  ? Years of education: Not on file  ? Highest  education level: 10th grade  ?Occupational History  ? Not on file  ?Tobacco Use  ? Smoking status: Former  ? Smokeless tobacco: Never  ? Tobacco comments:  ?  Quit smoking age 35   ?Vaping Use  ? Vaping Use: Never used  ?Substance and Sexual Activity  ? Alcohol use: No  ? Drug use: No  ? Sexual activity: Not on file  ?Other Topics Concern  ? Not on file  ?Social History Narrative  ? Not on file  ? ?Social Determinants of Health  ? ?Financial Resource Strain: Not on file  ?Food Insecurity: Not on file  ?Transportation Needs: Not on file  ?Physical Activity: Not on file  ?Stress: Not on file  ?Social Connections: Not on file  ?  ? ?Family History: ?The patient's family history includes Heart attack in her father; Kidney disease in her father. ? ?ROS:   ?Please see the history of present illness.    ?(+) Bilateral LE and foot pain ?(+) Palpitations ?(+) Lightheadedness ? All other systems reviewed and are negative. ? ?EKGs/Labs/Other Studies Reviewed:   ? ?Carotid Bilateral 03/07/2017: ?FINDINGS: ?Criteria: Quantification of carotid stenosis is based on velocity ?parameters that correlate the residual internal carotid diameter ?with NASCET-based stenosis levels, using the diameter of the distal ?internal carotid lumen as the denominator for stenosis measurement. ?  ?The following velocity measurements were obtained: ?  ?RIGHT ?  ?ICA:  151 cm/sec ?  ?CCA:  102 cm/sec ?  ?SYSTOLIC ICA/CCA RATIO:  1.5 ?  ?DIASTOLIC ICA/CCA RATIO:  2.7 ?  ?ECA:  54 cm/sec ?  ?LEFT ?  ?ICA:  143 cm/sec ?  ?CCA:  90 cm/sec ?  ?SYSTOLIC ICA/CCA RATIO:  1.6 ?  ?DIASTOLIC ICA/CCA RATIO:  3.2 ?  ?ECA:  77 cm/sec ?  ?RIGHT CAROTID ARTERY: Little if any plaque in the bulb. There is ?mild focal calcified plaque at the origin of the external carotid ?artery. Low resistance internal carotid Doppler pattern is ?preserved. ?  ?RIGHT VERTEBRAL ARTERY:  Antegrade. ?  ?LEFT CAROTID ARTERY: Little if any plaque in the bulb. Low ?resistance internal carotid  Doppler pattern is preserved. ?  ?LEFT VERTEBRAL ARTERY:  Antegrade. ?  ?IMPRESSION: ?Less than 50% stenosis in the right and left internal carotid ?arteries. ? ?Nuclear Stress Test 06/22/2016: ?The left ventricular ejection fraction is hyperdynamic (>65%). ?Nuclear stress EF: 75%. ?There was no ST segment deviation noted during stress. ?The study is normal. ?This is a low risk study. ? ?EKG:  EKG is  personally reviewed. ?09/29/2021: : Sinus rhythm. Rate 64 bpm. RBBB. ?08/28/2020: sinus rhythm 73 right bundle branch block ? ?Recent Labs: ?No results found for requested labs within last 8760 hours.  ?Recent Lipid Panel ?   ?  Component Value Date/Time  ? CHOL 163 07/24/2013 0913  ? TRIG 86.0 07/24/2013 0913  ? HDL 61.80 07/24/2013 0913  ? CHOLHDL 3 07/24/2013 0913  ? VLDL 17.2 07/24/2013 0913  ? [place] 84 07/24/2013 0913  ? ? ? ?Risk Assessment/Calculations:   ? ? ? ?Physical Exam:   ? ?VS:  BP (!) 146/60   Pulse 64   Ht '5\' 4"'$  (1.626 m)   Wt 147 lb 6.4 oz (66.9 kg)   SpO2 98%   BMI 25.30 kg/m?    ? ?Wt Readings from Last 3 Encounters:  ?09/29/21 147 lb 6.4 oz (66.9 kg)  ?08/28/20 154 lb (69.9 kg)  ?10/16/19 155 lb (70.3 kg)  ?  ? ?GEN:  Well nourished, well developed in no acute distress ?HEENT: Normal ?NECK: No JVD; No carotid bruits ?LYMPHATICS: No lymphadenopathy ?CARDIAC: RRR, no murmurs, rubs, gallops ?RESPIRATORY:  Clear to auscultation without rales, wheezing or rhonchi  ?ABDOMEN: Soft, non-tender, non-distended ?MUSCULOSKELETAL:  No edema; No deformity  ?SKIN: Warm and dry; varicose veins bilateral LE, Ecchymosis/lesion on left thigh due to dog bite. ?NEUROLOGIC:  Alert and oriented x 3 ?PSYCHIATRIC:  Normal affect  ? ?ASSESSMENT:   ? ?1. Palpitations   ?2. Coronary artery disease involving native coronary artery of native heart without angina pectoris   ?3. PAT (paroxysmal atrial tachycardia) ([place])   ?4. Aortic atherosclerosis ([place])   ?5. RBBB   ?6. Osteoarthritis, unspecified osteoarthritis type,  unspecified site   ?7. Hypercholesteremia   ?8. Carotid artery plaque, bilateral   ?9. Pure hypercholesterolemia   ? ? ?PLAN:   ? ?In order of problems listed above: ? ?CAD (coronary artery disease) ?Previously describ

## 2021-09-29 NOTE — Assessment & Plan Note (Signed)
Previously seen on monitor in 2017.  Thankfully no evidence of atrial fibrillation.  Continue with diltiazem.  Occasional skipping like sensation noted.  Previously she was always worried that her heart was going to stop.  Reassurance was given. ?

## 2021-09-29 NOTE — Assessment & Plan Note (Signed)
Previously described as nonobstructive coronary artery disease on cardiac catheterization in 2012.  Continue with goal-directed medical therapy. ?

## 2021-09-29 NOTE — Assessment & Plan Note (Signed)
LDL previously 78.  Excellent.  Continue with low-dose Crestor. ?

## 2021-09-29 NOTE — Assessment & Plan Note (Signed)
Stable, no evidence of syncope or other high risk symptoms. ?

## 2021-09-29 NOTE — Assessment & Plan Note (Signed)
Continue with Crestor 5 mg once a day.  Also on omega-3 fatty acids.  No myalgias.  Continue with current medical management ?

## 2021-09-29 NOTE — Assessment & Plan Note (Signed)
Mild aortic sclerosis noted on CT scan of abdomen.  Continue with statin use.  Aspirin 81 mg. ?

## 2021-09-29 NOTE — Patient Instructions (Signed)
Medication Instructions:  The current medical regimen is effective;  continue present plan and medications.  *If you need a refill on your cardiac medications before your next appointment, please call your pharmacy*  Testing/Procedures: ZIO XT- Long Term Monitor Instructions  Your physician has requested you wear a ZIO patch monitor for 14 days.  This is a single patch monitor. [employer] supplies one patch monitor per enrollment. Additional stickers are not available. Please do not apply patch if you will be having a Nuclear Stress Test,  Echocardiogram, Cardiac CT, MRI, or Chest Xray during the period you would be wearing the  monitor. The patch cannot be worn during these tests. You cannot remove and re-apply the  ZIO XT patch monitor.  Your ZIO patch monitor will be mailed 3 day USPS to your address on file. It may take 3-5 days  to receive your monitor after you have been enrolled.  Once you have received your monitor, please review the enclosed instructions. Your monitor  has already been registered assigning a specific monitor serial # to you.  Billing and Patient Assistance Program Information  We have supplied [employer] with any of your insurance information on file for billing purposes. [employer] offers a sliding scale Patient Assistance Program for patients that do not have  insurance, or whose insurance does not completely cover the cost of the ZIO monitor.  You must apply for the Patient Assistance Program to qualify for this discounted rate.  To apply, please call [employer] at [phone], select option 4, select option 2, ask to apply for  Patient Assistance Program. [employer] will ask your household income, and how many people  are in your household. They will quote your out-of-pocket cost based on that information.  [employer] will also be able to set up a 12-month, interest-free payment plan if needed.  Applying the monitor   Shave hair from upper left chest.  Hold abrader disc  by orange tab. Rub abrader in 40 strokes over the upper left chest as  indicated in your monitor instructions.  Clean area with 4 enclosed alcohol pads. Let dry.  Apply patch as indicated in monitor instructions. Patch will be placed under collarbone on left  side of chest with arrow pointing upward.  Rub patch adhesive wings for 2 minutes. Remove white label marked "1". Remove the white  label marked "2". Rub patch adhesive wings for 2 additional minutes.  While looking in a mirror, press and release button in center of patch. A small green light will  flash 3-4 times. This will be your only indicator that the monitor has been turned on.  Do not shower for the first 24 hours. You may shower after the first 24 hours.  Press the button if you feel a symptom. You will hear a small click. Record Date, Time and  Symptom in the Patient Logbook.  When you are ready to remove the patch, follow instructions on the last 2 pages of Patient  Logbook. Stick patch monitor onto the last page of Patient Logbook.  Place Patient Logbook in the blue and white box. Use locking tab on box and tape box closed  securely. The blue and white box has prepaid postage on it. Please place it in the mailbox as  soon as possible. Your physician should have your test results approximately 7 days after the  monitor has been mailed back to [employer].  Call [employer] Customer Care at [phone] if you have questions regarding  your ZIO XT patch   monitor. Call them immediately if you see an orange light blinking on your  monitor.  If your monitor falls off in less than 4 days, contact our Monitor department at [phone].  If your monitor becomes loose or falls off after 4 days call [employer] at [phone] for  suggestions on securing your monitor  Follow-Up: At [facility], you and your health needs are our priority.  As part of our continuing mission to provide you with exceptional heart care, we have  created designated Provider Care Teams.  These Care Teams include your primary Cardiologist (physician) and Advanced Practice Providers (APPs -  Physician Assistants and Nurse Practitioners) who all work together to provide you with the care you need, when you need it.  We recommend signing up for the patient portal called "MyChart".  Sign up information is provided on this After Visit Summary.  MyChart is used to connect with patients for Virtual Visits (Telemedicine).  Patients are able to view lab/test results, encounter notes, upcoming appointments, etc.  Non-urgent messages can be sent to your provider as well.   To learn more about what you can do with MyChart, go to [email or web address].    Your next appointment:   1 year(s)  The format for your next appointment:   In Person  Provider:   Mark Skains, MD     Thank you for choosing [facility]!!    

## 2021-09-29 NOTE — Assessment & Plan Note (Signed)
2018-mild bilaterally.  On statin, low-dose aspirin.  Excellent. ?

## 2021-10-05 DIAGNOSIS — I471 Supraventricular tachycardia: Secondary | ICD-10-CM | POA: Diagnosis not present

## 2021-10-05 DIAGNOSIS — R002 Palpitations: Secondary | ICD-10-CM

## 2021-10-23 DIAGNOSIS — I471 Supraventricular tachycardia: Secondary | ICD-10-CM | POA: Diagnosis not present

## 2021-10-23 DIAGNOSIS — R002 Palpitations: Secondary | ICD-10-CM | POA: Diagnosis not present

## 2021-12-09 ENCOUNTER — Other Ambulatory Visit: Payer: Self-pay | Admitting: Cardiology

## 2021-12-22 ENCOUNTER — Other Ambulatory Visit: Payer: Self-pay | Admitting: Specialist

## 2021-12-22 DIAGNOSIS — Z1231 Encounter for screening mammogram for malignant neoplasm of breast: Secondary | ICD-10-CM

## 2022-02-01 ENCOUNTER — Ambulatory Visit
Admission: RE | Admit: 2022-02-01 | Discharge: 2022-02-01 | Disposition: A | Discharge status: Home / Self Care | Payer: Medicare Other | Source: Ambulatory Visit | Attending: Specialist | Admitting: Specialist

## 2022-02-01 DIAGNOSIS — Z1231 Encounter for screening mammogram for malignant neoplasm of breast: Secondary | ICD-10-CM | POA: Diagnosis not present

## 2022-03-08 DIAGNOSIS — J343 Hypertrophy of nasal turbinates: Secondary | ICD-10-CM | POA: Diagnosis not present

## 2022-03-08 DIAGNOSIS — H903 Sensorineural hearing loss, bilateral: Secondary | ICD-10-CM | POA: Diagnosis not present

## 2022-03-08 DIAGNOSIS — J342 Deviated nasal septum: Secondary | ICD-10-CM | POA: Diagnosis not present

## 2022-03-08 DIAGNOSIS — H6983 Other specified disorders of Eustachian tube, bilateral: Secondary | ICD-10-CM | POA: Diagnosis not present

## 2022-03-08 DIAGNOSIS — J310 Chronic rhinitis: Secondary | ICD-10-CM | POA: Diagnosis not present

## 2022-03-23 DIAGNOSIS — I480 Paroxysmal atrial fibrillation: Secondary | ICD-10-CM | POA: Diagnosis not present

## 2022-03-23 DIAGNOSIS — I2510 Atherosclerotic heart disease of native coronary artery without angina pectoris: Secondary | ICD-10-CM | POA: Diagnosis not present

## 2022-03-23 DIAGNOSIS — I10 Essential (primary) hypertension: Secondary | ICD-10-CM | POA: Diagnosis not present

## 2022-03-23 DIAGNOSIS — R2232 Localized swelling, mass and lump, left upper limb: Secondary | ICD-10-CM | POA: Diagnosis not present

## 2022-03-23 DIAGNOSIS — E782 Mixed hyperlipidemia: Secondary | ICD-10-CM | POA: Diagnosis not present

## 2022-03-23 DIAGNOSIS — G4700 Insomnia, unspecified: Secondary | ICD-10-CM | POA: Diagnosis not present

## 2022-03-24 DIAGNOSIS — R2232 Localized swelling, mass and lump, left upper limb: Secondary | ICD-10-CM | POA: Diagnosis not present

## 2022-03-24 DIAGNOSIS — Z0389 Encounter for observation for other suspected diseases and conditions ruled out: Secondary | ICD-10-CM | POA: Diagnosis not present

## 2022-03-24 DIAGNOSIS — R229 Localized swelling, mass and lump, unspecified: Secondary | ICD-10-CM | POA: Diagnosis not present

## 2022-04-14 DIAGNOSIS — I6523 Occlusion and stenosis of bilateral carotid arteries: Secondary | ICD-10-CM | POA: Diagnosis not present

## 2022-04-14 DIAGNOSIS — Z1331 Encounter for screening for depression: Secondary | ICD-10-CM | POA: Diagnosis not present

## 2022-04-14 DIAGNOSIS — I2510 Atherosclerotic heart disease of native coronary artery without angina pectoris: Secondary | ICD-10-CM | POA: Diagnosis not present

## 2022-04-14 DIAGNOSIS — G4700 Insomnia, unspecified: Secondary | ICD-10-CM | POA: Diagnosis not present

## 2022-04-14 DIAGNOSIS — I4719 Other supraventricular tachycardia: Secondary | ICD-10-CM | POA: Diagnosis not present

## 2022-04-14 DIAGNOSIS — R2232 Localized swelling, mass and lump, left upper limb: Secondary | ICD-10-CM | POA: Diagnosis not present

## 2022-04-14 DIAGNOSIS — F4321 Adjustment disorder with depressed mood: Secondary | ICD-10-CM | POA: Diagnosis not present

## 2022-04-14 DIAGNOSIS — I10 Essential (primary) hypertension: Secondary | ICD-10-CM | POA: Diagnosis not present

## 2022-04-14 DIAGNOSIS — Z79899 Other long term (current) drug therapy: Secondary | ICD-10-CM | POA: Diagnosis not present

## 2022-04-14 DIAGNOSIS — M26609 Unspecified temporomandibular joint disorder, unspecified side: Secondary | ICD-10-CM | POA: Diagnosis not present

## 2022-04-14 DIAGNOSIS — Z0000 Encounter for general adult medical examination without abnormal findings: Secondary | ICD-10-CM | POA: Diagnosis not present

## 2022-04-15 ENCOUNTER — Other Ambulatory Visit: Payer: Self-pay | Admitting: Internal Medicine

## 2022-04-15 DIAGNOSIS — I6523 Occlusion and stenosis of bilateral carotid arteries: Secondary | ICD-10-CM

## 2022-04-15 DIAGNOSIS — R2232 Localized swelling, mass and lump, left upper limb: Secondary | ICD-10-CM

## 2022-04-15 DIAGNOSIS — I10 Essential (primary) hypertension: Secondary | ICD-10-CM

## 2022-04-21 ENCOUNTER — Ambulatory Visit
Admission: RE | Admit: 2022-04-21 | Discharge: 2022-04-21 | Disposition: A | Discharge status: Home / Self Care | Payer: Medicare Other | Source: Ambulatory Visit | Attending: Internal Medicine | Admitting: Internal Medicine

## 2022-04-21 DIAGNOSIS — I6523 Occlusion and stenosis of bilateral carotid arteries: Secondary | ICD-10-CM

## 2022-04-21 DIAGNOSIS — E782 Mixed hyperlipidemia: Secondary | ICD-10-CM | POA: Diagnosis not present

## 2022-04-21 DIAGNOSIS — I10 Essential (primary) hypertension: Secondary | ICD-10-CM | POA: Diagnosis not present

## 2022-04-26 ENCOUNTER — Telehealth: Payer: Self-pay

## 2022-04-26 MED ORDER — ROSUVASTATIN CALCIUM 5 MG PO TABS: 5.0000 mg | ORAL_TABLET | Freq: Every day | ORAL | 1 refills | Status: DC

## 2022-04-26 NOTE — Telephone Encounter (Signed)
Pt calling requesting a refill on rosuvastatin. Pt states that she only takes 1/4 tablet daily. This is not the directions on the medication and this medication has not been refilled since 2021. Would Dr. Marlou Porch like to refill this medication with the directions that the pt is taking 1/4 tablet daily? Please address

## 2022-04-26 NOTE — Telephone Encounter (Signed)
Spoke with pt who reports 5 mg daily was causing her muscle pain and she decreased it to 1/2 tablet daily.  Muscle pain continued on 1/2 tablet and she decreased to 1/4 tablet.  Pt reports no muscle pain for years on this dose.   Advised pt we do not have any recent lipid information in her chart and need this.  Pt recently had a wellness physical and she will have most recent lab sent to Dr Marlou Porch for her record.

## 2022-04-27 ENCOUNTER — Other Ambulatory Visit: Payer: Self-pay | Admitting: Internal Medicine

## 2022-04-27 ENCOUNTER — Ambulatory Visit
Admission: RE | Admit: 2022-04-27 | Discharge: 2022-04-27 | Disposition: A | Discharge status: Home / Self Care | Payer: Medicare Other | Source: Ambulatory Visit | Attending: Internal Medicine | Admitting: Internal Medicine

## 2022-04-27 ENCOUNTER — Encounter: Payer: Self-pay | Admitting: Radiology

## 2022-04-27 DIAGNOSIS — I10 Essential (primary) hypertension: Secondary | ICD-10-CM

## 2022-04-27 DIAGNOSIS — R2232 Localized swelling, mass and lump, left upper limb: Secondary | ICD-10-CM

## 2022-04-27 MED ORDER — IOPAMIDOL (ISOVUE-300) INJECTION 61%
75.0000 mL | Freq: Once | INTRAVENOUS | Status: AC | PRN
  Administered 2022-04-27: 75 mL via INTRAVENOUS

## 2022-05-10 DIAGNOSIS — D2271 Melanocytic nevi of right lower limb, including hip: Secondary | ICD-10-CM | POA: Diagnosis not present

## 2022-05-10 DIAGNOSIS — D1801 Hemangioma of skin and subcutaneous tissue: Secondary | ICD-10-CM | POA: Diagnosis not present

## 2022-05-10 DIAGNOSIS — L821 Other seborrheic keratosis: Secondary | ICD-10-CM | POA: Diagnosis not present

## 2022-05-10 DIAGNOSIS — L720 Epidermal cyst: Secondary | ICD-10-CM | POA: Diagnosis not present

## 2022-05-10 DIAGNOSIS — L814 Other melanin hyperpigmentation: Secondary | ICD-10-CM | POA: Diagnosis not present

## 2022-05-10 DIAGNOSIS — Z85828 Personal history of other malignant neoplasm of skin: Secondary | ICD-10-CM | POA: Diagnosis not present

## 2022-05-10 DIAGNOSIS — L84 Corns and callosities: Secondary | ICD-10-CM | POA: Diagnosis not present

## 2022-05-10 DIAGNOSIS — D2272 Melanocytic nevi of left lower limb, including hip: Secondary | ICD-10-CM | POA: Diagnosis not present

## 2022-05-10 DIAGNOSIS — D692 Other nonthrombocytopenic purpura: Secondary | ICD-10-CM | POA: Diagnosis not present

## 2022-05-14 ENCOUNTER — Other Ambulatory Visit: Payer: Self-pay | Admitting: Internal Medicine

## 2022-05-14 DIAGNOSIS — R079 Chest pain, unspecified: Secondary | ICD-10-CM | POA: Diagnosis not present

## 2022-05-14 DIAGNOSIS — N644 Mastodynia: Secondary | ICD-10-CM

## 2022-06-02 ENCOUNTER — Ambulatory Visit
Admission: RE | Admit: 2022-06-02 | Discharge: 2022-06-02 | Disposition: A | Discharge status: Home / Self Care | Payer: Medicare Other | Source: Ambulatory Visit | Attending: Internal Medicine | Admitting: Internal Medicine

## 2022-06-02 ENCOUNTER — Other Ambulatory Visit: Payer: Self-pay | Admitting: Internal Medicine

## 2022-06-02 DIAGNOSIS — N644 Mastodynia: Secondary | ICD-10-CM

## 2022-09-06 DIAGNOSIS — H903 Sensorineural hearing loss, bilateral: Secondary | ICD-10-CM | POA: Diagnosis not present

## 2022-09-06 DIAGNOSIS — R0982 Postnasal drip: Secondary | ICD-10-CM | POA: Diagnosis not present

## 2022-09-06 DIAGNOSIS — H838X3 Other specified diseases of inner ear, bilateral: Secondary | ICD-10-CM | POA: Diagnosis not present

## 2022-09-06 DIAGNOSIS — J343 Hypertrophy of nasal turbinates: Secondary | ICD-10-CM | POA: Diagnosis not present

## 2022-09-06 DIAGNOSIS — H9313 Tinnitus, bilateral: Secondary | ICD-10-CM | POA: Diagnosis not present

## 2022-09-06 DIAGNOSIS — J310 Chronic rhinitis: Secondary | ICD-10-CM | POA: Diagnosis not present

## 2022-09-06 DIAGNOSIS — J342 Deviated nasal septum: Secondary | ICD-10-CM | POA: Diagnosis not present

## 2022-09-17 DIAGNOSIS — H25013 Cortical age-related cataract, bilateral: Secondary | ICD-10-CM | POA: Diagnosis not present

## 2022-09-17 DIAGNOSIS — H2513 Age-related nuclear cataract, bilateral: Secondary | ICD-10-CM | POA: Diagnosis not present

## 2022-09-17 DIAGNOSIS — H524 Presbyopia: Secondary | ICD-10-CM | POA: Diagnosis not present

## 2022-09-17 DIAGNOSIS — H5203 Hypermetropia, bilateral: Secondary | ICD-10-CM | POA: Diagnosis not present

## 2022-10-15 DIAGNOSIS — I4719 Other supraventricular tachycardia: Secondary | ICD-10-CM | POA: Diagnosis not present

## 2022-10-15 DIAGNOSIS — I6523 Occlusion and stenosis of bilateral carotid arteries: Secondary | ICD-10-CM | POA: Diagnosis not present

## 2022-10-15 DIAGNOSIS — I2510 Atherosclerotic heart disease of native coronary artery without angina pectoris: Secondary | ICD-10-CM | POA: Diagnosis not present

## 2022-10-15 DIAGNOSIS — N644 Mastodynia: Secondary | ICD-10-CM | POA: Diagnosis not present

## 2022-10-15 DIAGNOSIS — F4321 Adjustment disorder with depressed mood: Secondary | ICD-10-CM | POA: Diagnosis not present

## 2022-10-15 DIAGNOSIS — I10 Essential (primary) hypertension: Secondary | ICD-10-CM | POA: Diagnosis not present

## 2022-10-15 DIAGNOSIS — F5101 Primary insomnia: Secondary | ICD-10-CM | POA: Diagnosis not present

## 2022-10-18 ENCOUNTER — Other Ambulatory Visit: Payer: Self-pay | Admitting: Internal Medicine

## 2022-10-18 DIAGNOSIS — N644 Mastodynia: Secondary | ICD-10-CM

## 2022-10-25 ENCOUNTER — Ambulatory Visit
Admission: RE | Admit: 2022-10-25 | Discharge: 2022-10-25 | Disposition: A | Discharge status: Home / Self Care | Payer: Medicare Other | Source: Ambulatory Visit | Attending: Internal Medicine | Admitting: Internal Medicine

## 2022-10-25 DIAGNOSIS — N644 Mastodynia: Secondary | ICD-10-CM

## 2022-11-10 DIAGNOSIS — R29890 Loss of height: Secondary | ICD-10-CM | POA: Diagnosis not present

## 2022-11-10 DIAGNOSIS — K219 Gastro-esophageal reflux disease without esophagitis: Secondary | ICD-10-CM | POA: Diagnosis not present

## 2022-11-10 DIAGNOSIS — Z6825 Body mass index (BMI) 25.0-25.9, adult: Secondary | ICD-10-CM | POA: Diagnosis not present

## 2022-11-10 DIAGNOSIS — N644 Mastodynia: Secondary | ICD-10-CM | POA: Diagnosis not present

## 2022-11-10 DIAGNOSIS — Z01419 Encounter for gynecological examination (general) (routine) without abnormal findings: Secondary | ICD-10-CM | POA: Diagnosis not present

## 2022-11-10 DIAGNOSIS — M8588 Other specified disorders of bone density and structure, other site: Secondary | ICD-10-CM | POA: Diagnosis not present

## 2022-11-10 DIAGNOSIS — N958 Other specified menopausal and perimenopausal disorders: Secondary | ICD-10-CM | POA: Diagnosis not present

## 2022-11-17 DIAGNOSIS — H16201 Unspecified keratoconjunctivitis, right eye: Secondary | ICD-10-CM | POA: Diagnosis not present

## 2022-11-19 DIAGNOSIS — H16201 Unspecified keratoconjunctivitis, right eye: Secondary | ICD-10-CM | POA: Diagnosis not present

## 2022-11-26 ENCOUNTER — Ambulatory Visit: Payer: Medicare Other | Admitting: Cardiology

## 2022-11-29 DIAGNOSIS — H04123 Dry eye syndrome of bilateral lacrimal glands: Secondary | ICD-10-CM | POA: Diagnosis not present

## 2022-11-29 DIAGNOSIS — H02831 Dermatochalasis of right upper eyelid: Secondary | ICD-10-CM | POA: Diagnosis not present

## 2022-11-29 DIAGNOSIS — H02834 Dermatochalasis of left upper eyelid: Secondary | ICD-10-CM | POA: Diagnosis not present

## 2022-12-06 ENCOUNTER — Other Ambulatory Visit: Payer: Self-pay | Admitting: Cardiology

## 2023-01-04 ENCOUNTER — Ambulatory Visit (INDEPENDENT_AMBULATORY_CARE_PROVIDER_SITE_OTHER): Payer: Medicare Other | Admitting: Cardiology

## 2023-01-04 ENCOUNTER — Encounter (HOSPITAL_BASED_OUTPATIENT_CLINIC_OR_DEPARTMENT_OTHER): Payer: Self-pay | Admitting: Cardiology

## 2023-01-04 VITALS — BP 146/52 | HR 55 | Ht 64.0 in | Wt 143.0 lb

## 2023-01-04 DIAGNOSIS — I2510 Atherosclerotic heart disease of native coronary artery without angina pectoris: Secondary | ICD-10-CM

## 2023-01-04 DIAGNOSIS — E7800 Pure hypercholesterolemia, unspecified: Secondary | ICD-10-CM | POA: Diagnosis not present

## 2023-01-04 DIAGNOSIS — I4510 Unspecified right bundle-branch block: Secondary | ICD-10-CM

## 2023-01-04 DIAGNOSIS — R002 Palpitations: Secondary | ICD-10-CM | POA: Diagnosis not present

## 2023-01-04 DIAGNOSIS — I4719 Other supraventricular tachycardia: Secondary | ICD-10-CM

## 2023-01-04 DIAGNOSIS — I700 Atherosclerosis of aorta: Secondary | ICD-10-CM

## 2023-01-04 NOTE — Patient Instructions (Signed)
Medication Instructions:  The current medical regimen is effective;  continue present plan and medications.  *If you need a refill on your cardiac medications before your next appointment, please call your pharmacy*  Follow-Up: At [facility], you and your health needs are our priority.  As part of our continuing mission to provide you with exceptional heart care, we have created designated Provider Care Teams.  These Care Teams include your primary Cardiologist (physician) and Advanced Practice Providers (APPs -  Physician Assistants and Nurse Practitioners) who all work together to provide you with the care you need, when you need it.  We recommend signing up for the patient portal called "MyChart".  Sign up information is provided on this After Visit Summary.  MyChart is used to connect with patients for Virtual Visits (Telemedicine).  Patients are able to view lab/test results, encounter notes, upcoming appointments, etc.  Non-urgent messages can be sent to your provider as well.   To learn more about what you can do with MyChart, go to [email or web address].    Your next appointment:   1 year(s)  Provider:   Mark Skains, MD      

## 2023-01-04 NOTE — Progress Notes (Signed)
Cardiology Office Note:  .   Date:  01/04/2023  ID:  [name], DOB 1936-06-07, MRN [id number] PCP: Marden Noble, MD (Inactive)  [facility] Providers Cardiologist:  Donato Schultz, MD    History of Present Illness: .   [name] is a 87 y.o. female here for follow-up palpitations.  Previously had paroxysmal atrial tachycardia nonobstructive coronary artery disease with right bundle branch block which is chronic.  Prior stress test was reassuring.    She started taking her diltiazem and lisinopril both in the morning and her blood pressures have markedly improved.  She is off of the low-dose hydralazine 3 times a day.  She should be several readings in her notebook and usually they are in the 110s to 120s over 50s to 60s diastolic.  She was concerned about the low diastolic blood pressure and I explained to her that this is likely from arterial stiffness.  She is not having any symptoms with it.  No syncope.  No chest pain.  We also reviewed her most recent CT of the chest.  There is mild coronary calcification present as well as aortic atherosclerosis.  No evidence of aneurysm.    ROS: As above  Studies Reviewed: Marland Kitchen   EKG Interpretation Date/Time:  Tuesday January 04 2023 11:47:35 EDT Ventricular Rate:  55 PR Interval:  194 QRS Duration:  158 QT Interval:  438 QTC Calculation: 419 R Axis:   80  Text Interpretation: Sinus bradycardia Right bundle branch block When compared with ECG of 18-Feb-2019 12:47, No significant change since last tracing Confirmed by Donato Schultz ([id number]) on 01/04/2023 12:07:51 PM    ZIO monitor May 2023-occasional atrial tachycardia short burst with rare premature contractions.  No atrial fibrillation. Risk Assessment/Calculations:           Physical Exam:   VS:  BP (!) 146/52   Pulse (!) 55   Ht 5\' 4"  (1.626 m)   Wt 143 lb (64.9 kg)   BMI 24.55 kg/m    Wt Readings from Last 3 Encounters:  01/04/23 143 lb (64.9 kg)  09/29/21 147 lb 6.4 oz  (66.9 kg)  08/28/20 154 lb (69.9 kg)    GEN: Well nourished, well developed in no acute distress NECK: No JVD; No carotid bruits CARDIAC: RRR, no murmurs, rubs, gallops RESPIRATORY:  Clear to auscultation without rales, wheezing or rhonchi  ABDOMEN: Soft, non-tender, non-distended EXTREMITIES:  No edema; No deformity   ASSESSMENT AND PLAN: .   Coronary artery disease - Nonobstructive on cath 2012.  Goal-directed medical therapy.  She is on low-dose Crestor.  No changes made.  Last LDL 84.  Optimal less than 70.  Paroxysmal atrial tachycardia - Previous ZIO monitor in 2017 no evidence of atrial fibrillation.  Reassuring.  Aortic atherosclerosis - Seen on CT of abdomen personally reviewed.  Continue with statin use and aspirin 81.  Right bundle branch block - Stable without any evidence of syncope.  Hyperlipidemia - Continuing with Crestor 5 mg once a day as well as fish oil.  LDL goal less than 70.  Carotid artery plaque bilaterally - Mild 2018.  Statin and low-dose aspirin.  Palpitations - Likely PACs and PVCs.  Hypertension --lisinopril and diltiazem both now in the morning. BP improved. Off hydralazine. 110/50.   I also noticed her get up from the chair without holding onto the sides of the chair.  She is doing her therapy exercises.  Excellent.      Dispo: 1 year  Signed, Donato Schultz, MD

## 2023-01-06 DIAGNOSIS — N644 Mastodynia: Secondary | ICD-10-CM | POA: Diagnosis not present

## 2023-01-06 DIAGNOSIS — I10 Essential (primary) hypertension: Secondary | ICD-10-CM | POA: Diagnosis not present

## 2023-01-10 ENCOUNTER — Other Ambulatory Visit: Payer: Self-pay | Admitting: Internal Medicine

## 2023-01-10 DIAGNOSIS — N644 Mastodynia: Secondary | ICD-10-CM

## 2023-01-12 DIAGNOSIS — H2513 Age-related nuclear cataract, bilateral: Secondary | ICD-10-CM | POA: Diagnosis not present

## 2023-01-12 DIAGNOSIS — H524 Presbyopia: Secondary | ICD-10-CM | POA: Diagnosis not present

## 2023-01-12 DIAGNOSIS — H5203 Hypermetropia, bilateral: Secondary | ICD-10-CM | POA: Diagnosis not present

## 2023-01-12 DIAGNOSIS — H25013 Cortical age-related cataract, bilateral: Secondary | ICD-10-CM | POA: Diagnosis not present

## 2023-01-18 ENCOUNTER — Ambulatory Visit
Admission: RE | Admit: 2023-01-18 | Discharge: 2023-01-18 | Disposition: A | Discharge status: Home / Self Care | Payer: Medicare Other | Source: Ambulatory Visit | Attending: Internal Medicine | Admitting: Internal Medicine

## 2023-01-18 DIAGNOSIS — N644 Mastodynia: Secondary | ICD-10-CM

## 2023-01-18 MED ORDER — GADOPICLENOL 0.5 MMOL/ML IV SOLN
6.0000 mL | Freq: Once | INTRAVENOUS | Status: AC | PRN
  Administered 2023-01-18: 6 mL via INTRAVENOUS

## 2023-03-01 DIAGNOSIS — H25012 Cortical age-related cataract, left eye: Secondary | ICD-10-CM | POA: Diagnosis not present

## 2023-03-01 DIAGNOSIS — H25812 Combined forms of age-related cataract, left eye: Secondary | ICD-10-CM | POA: Diagnosis not present

## 2023-03-01 DIAGNOSIS — H25811 Combined forms of age-related cataract, right eye: Secondary | ICD-10-CM | POA: Diagnosis not present

## 2023-03-01 DIAGNOSIS — H25042 Posterior subcapsular polar age-related cataract, left eye: Secondary | ICD-10-CM | POA: Diagnosis not present

## 2023-03-01 DIAGNOSIS — H2512 Age-related nuclear cataract, left eye: Secondary | ICD-10-CM | POA: Diagnosis not present

## 2023-03-01 DIAGNOSIS — Z961 Presence of intraocular lens: Secondary | ICD-10-CM | POA: Diagnosis not present

## 2023-03-15 DIAGNOSIS — H2511 Age-related nuclear cataract, right eye: Secondary | ICD-10-CM | POA: Diagnosis not present

## 2023-03-15 DIAGNOSIS — H25811 Combined forms of age-related cataract, right eye: Secondary | ICD-10-CM | POA: Diagnosis not present

## 2023-03-15 DIAGNOSIS — H25011 Cortical age-related cataract, right eye: Secondary | ICD-10-CM | POA: Diagnosis not present

## 2023-03-15 DIAGNOSIS — H25041 Posterior subcapsular polar age-related cataract, right eye: Secondary | ICD-10-CM | POA: Diagnosis not present

## 2023-03-15 DIAGNOSIS — Z961 Presence of intraocular lens: Secondary | ICD-10-CM | POA: Diagnosis not present

## 2023-04-01 ENCOUNTER — Ambulatory Visit: Payer: Medicare Other | Admitting: Cardiology

## 2023-04-20 DIAGNOSIS — Z1331 Encounter for screening for depression: Secondary | ICD-10-CM | POA: Diagnosis not present

## 2023-04-20 DIAGNOSIS — I6523 Occlusion and stenosis of bilateral carotid arteries: Secondary | ICD-10-CM | POA: Diagnosis not present

## 2023-04-20 DIAGNOSIS — E559 Vitamin D deficiency, unspecified: Secondary | ICD-10-CM | POA: Diagnosis not present

## 2023-04-20 DIAGNOSIS — M1711 Unilateral primary osteoarthritis, right knee: Secondary | ICD-10-CM | POA: Diagnosis not present

## 2023-04-20 DIAGNOSIS — L84 Corns and callosities: Secondary | ICD-10-CM | POA: Diagnosis not present

## 2023-04-20 DIAGNOSIS — G4700 Insomnia, unspecified: Secondary | ICD-10-CM | POA: Diagnosis not present

## 2023-04-20 DIAGNOSIS — I10 Essential (primary) hypertension: Secondary | ICD-10-CM | POA: Diagnosis not present

## 2023-04-20 DIAGNOSIS — I2510 Atherosclerotic heart disease of native coronary artery without angina pectoris: Secondary | ICD-10-CM | POA: Diagnosis not present

## 2023-04-20 DIAGNOSIS — Z79899 Other long term (current) drug therapy: Secondary | ICD-10-CM | POA: Diagnosis not present

## 2023-04-20 DIAGNOSIS — I4719 Other supraventricular tachycardia: Secondary | ICD-10-CM | POA: Diagnosis not present

## 2023-04-20 DIAGNOSIS — Z0000 Encounter for general adult medical examination without abnormal findings: Secondary | ICD-10-CM | POA: Diagnosis not present

## 2023-04-20 DIAGNOSIS — Z23 Encounter for immunization: Secondary | ICD-10-CM | POA: Diagnosis not present

## 2023-05-12 DIAGNOSIS — L430 Hypertrophic lichen planus: Secondary | ICD-10-CM | POA: Diagnosis not present

## 2023-05-12 DIAGNOSIS — D2261 Melanocytic nevi of right upper limb, including shoulder: Secondary | ICD-10-CM | POA: Diagnosis not present

## 2023-05-12 DIAGNOSIS — D485 Neoplasm of uncertain behavior of skin: Secondary | ICD-10-CM | POA: Diagnosis not present

## 2023-05-12 DIAGNOSIS — L814 Other melanin hyperpigmentation: Secondary | ICD-10-CM | POA: Diagnosis not present

## 2023-05-12 DIAGNOSIS — L570 Actinic keratosis: Secondary | ICD-10-CM | POA: Diagnosis not present

## 2023-05-12 DIAGNOSIS — Z85828 Personal history of other malignant neoplasm of skin: Secondary | ICD-10-CM | POA: Diagnosis not present

## 2023-05-30 DIAGNOSIS — R195 Other fecal abnormalities: Secondary | ICD-10-CM | POA: Diagnosis not present

## 2023-05-30 DIAGNOSIS — Z7729 Contact with and (suspected ) exposure to other hazardous substances: Secondary | ICD-10-CM | POA: Diagnosis not present

## 2023-06-07 ENCOUNTER — Other Ambulatory Visit: Payer: Self-pay | Admitting: Cardiology

## 2023-08-01 DIAGNOSIS — H04123 Dry eye syndrome of bilateral lacrimal glands: Secondary | ICD-10-CM | POA: Diagnosis not present

## 2023-09-20 ENCOUNTER — Telehealth (INDEPENDENT_AMBULATORY_CARE_PROVIDER_SITE_OTHER): Payer: Self-pay | Admitting: Otolaryngology

## 2023-09-20 NOTE — Telephone Encounter (Signed)
 Tried to call to confirm appt & location - # not in service. [id number]

## 2023-09-21 ENCOUNTER — Encounter (INDEPENDENT_AMBULATORY_CARE_PROVIDER_SITE_OTHER): Payer: Self-pay

## 2023-09-21 ENCOUNTER — Ambulatory Visit (INDEPENDENT_AMBULATORY_CARE_PROVIDER_SITE_OTHER): Payer: Medicare Other | Admitting: Otolaryngology

## 2023-09-21 VITALS — BP 124/52 | HR 60 | Resp 95 | Ht 64.5 in | Wt 148.0 lb

## 2023-09-21 DIAGNOSIS — J310 Chronic rhinitis: Secondary | ICD-10-CM | POA: Diagnosis not present

## 2023-09-21 DIAGNOSIS — H903 Sensorineural hearing loss, bilateral: Secondary | ICD-10-CM

## 2023-09-21 DIAGNOSIS — H9313 Tinnitus, bilateral: Secondary | ICD-10-CM | POA: Diagnosis not present

## 2023-09-21 DIAGNOSIS — R0981 Nasal congestion: Secondary | ICD-10-CM

## 2023-09-21 DIAGNOSIS — J342 Deviated nasal septum: Secondary | ICD-10-CM | POA: Diagnosis not present

## 2023-09-21 DIAGNOSIS — J343 Hypertrophy of nasal turbinates: Secondary | ICD-10-CM | POA: Diagnosis not present

## 2023-09-22 DIAGNOSIS — H903 Sensorineural hearing loss, bilateral: Secondary | ICD-10-CM | POA: Insufficient documentation

## 2023-09-22 DIAGNOSIS — J343 Hypertrophy of nasal turbinates: Secondary | ICD-10-CM | POA: Insufficient documentation

## 2023-09-22 DIAGNOSIS — H9313 Tinnitus, bilateral: Secondary | ICD-10-CM | POA: Insufficient documentation

## 2023-09-22 DIAGNOSIS — J310 Chronic rhinitis: Secondary | ICD-10-CM | POA: Insufficient documentation

## 2023-09-22 DIAGNOSIS — J342 Deviated nasal septum: Secondary | ICD-10-CM | POA: Insufficient documentation

## 2023-09-22 NOTE — Progress Notes (Signed)
 Patient ID: [name], female   DOB: [birth year], 88 y.o.   MRN: [id number]  Follow up: Chronic nasal obstruction, clogging sensation in ears, hearing loss, tinnitus  HPI: The patient is an 88 year old female who returns today for her follow-up evaluation.  She was last seen in March 2024.  At that time, she was complaining of chronic nasal congestion, bilateral hearing loss, and bilateral tinnitus.  She was noted to have severe nasal septal deviation and bilateral inferior turbinate hypertrophy, obstructing more than 95% of her nasal passageways.  She was also noted to have bilateral high-frequency sensorineural hearing loss and eustachian tube dysfunction.  She was treated with Zyrtec, nasal saline irrigation, Flonase, and daily Valsalva exercise.  The hearing amplification options were also discussed.  The patient returns today complaining of persistent hearing loss and tinnitus.  She is wearing bilateral hearing aids.  She also reports persistent congestion, especially at night.  The patient has stopped the use of Flonase due to her epistaxis.  Currently she denies any otalgia, otorrhea, facial pain, fever, or visual change.  Exam: General: Communicates without difficulty, well nourished, no acute distress. Head: Normocephalic, no evidence injury, no tenderness, facial buttresses intact without stepoff. Face/sinus: No tenderness to palpation and percussion. Facial movement is normal and symmetric. Eyes: PERRL, EOMI. No scleral icterus, conjunctivae clear. Neuro: CN II exam reveals vision grossly intact.  No nystagmus at any point of gaze. Ears: Auricles well formed without lesions.  Ear canals are intact without mass or lesion.  No erythema or edema is appreciated.  The TMs are intact without fluid. Nose: External evaluation reveals normal support and skin without lesions.  Dorsum is intact.  Anterior rhinoscopy reveals congested mucosa over anterior aspect of inferior turbinates and deviated septum.   No purulence noted. Oral:  Oral cavity and oropharynx are intact, symmetric, without erythema or edema.  Mucosa is moist without lesions. Neck: Full range of motion without pain.  There is no significant lymphadenopathy.  No masses palpable.  Thyroid bed within normal limits to palpation.  Parotid glands and submandibular glands equal bilaterally without mass.  Trachea is midline. Neuro:  CN 2-12 grossly intact.   Assessment: 1.  Chronic rhinitis with nasal mucosal congestion, nasal septal deviation, and bilateral inferior turbinate hypertrophy. 2.  No polyps, mass, lesion, or acute infection is noted today. 3.  Subjectively stable bilateral sensorineural hearing loss and tinnitus.  Plan: 1.  The physical exam findings are reviewed with the patient.  2.  Continue with Zyrtec and saline irrigation. 3.  The option of septoplasty and turbinate reduction surgery to treat her chronic nasal obstruction is discussed. 4.  The strategies to cope with tinnitus, including the use of masker, hearing aids, tinnitus retraining therapy, and avoidance of caffeine and alcohol are discussed. 5.  Continue the use of her hearing aids. 6.  The patient will return for reevaluation in 1 year, sooner if needed.

## 2023-10-20 DIAGNOSIS — R195 Other fecal abnormalities: Secondary | ICD-10-CM | POA: Diagnosis not present

## 2023-10-20 DIAGNOSIS — R21 Rash and other nonspecific skin eruption: Secondary | ICD-10-CM | POA: Diagnosis not present

## 2023-10-20 DIAGNOSIS — I10 Essential (primary) hypertension: Secondary | ICD-10-CM | POA: Diagnosis not present

## 2023-10-20 DIAGNOSIS — K219 Gastro-esophageal reflux disease without esophagitis: Secondary | ICD-10-CM | POA: Diagnosis not present

## 2023-10-20 DIAGNOSIS — M25561 Pain in right knee: Secondary | ICD-10-CM | POA: Diagnosis not present

## 2023-12-05 ENCOUNTER — Other Ambulatory Visit: Payer: Self-pay | Admitting: Cardiology

## 2023-12-06 ENCOUNTER — Other Ambulatory Visit: Payer: Self-pay

## 2023-12-06 MED ORDER — DILTIAZEM HCL ER COATED BEADS 180 MG PO CP24: 180.0000 mg | ORAL_CAPSULE | Freq: Every day | ORAL | 0 refills | Status: DC

## 2024-01-09 DIAGNOSIS — M25561 Pain in right knee: Secondary | ICD-10-CM | POA: Diagnosis not present

## 2024-02-22 ENCOUNTER — Ambulatory Visit: Attending: Cardiology | Admitting: Cardiology

## 2024-02-22 ENCOUNTER — Encounter: Payer: Self-pay | Admitting: Cardiology

## 2024-02-22 VITALS — BP 132/50 | HR 60 | Ht 64.0 in | Wt 149.4 lb

## 2024-02-22 DIAGNOSIS — I2510 Atherosclerotic heart disease of native coronary artery without angina pectoris: Secondary | ICD-10-CM | POA: Insufficient documentation

## 2024-02-22 DIAGNOSIS — I700 Atherosclerosis of aorta: Secondary | ICD-10-CM | POA: Diagnosis not present

## 2024-02-22 DIAGNOSIS — R002 Palpitations: Secondary | ICD-10-CM | POA: Diagnosis not present

## 2024-02-22 DIAGNOSIS — I4510 Unspecified right bundle-branch block: Secondary | ICD-10-CM | POA: Insufficient documentation

## 2024-02-22 DIAGNOSIS — I4719 Other supraventricular tachycardia: Secondary | ICD-10-CM | POA: Diagnosis not present

## 2024-02-22 NOTE — Progress Notes (Signed)
 Cardiology Office Note:  .   Date:  02/22/2024  ID:  CHERRY TURLINGTON, DOB 02/14/1936, MRN [id number] PCP: Delice Charleston, MD (Inactive)  [facility] Providers Cardiologist:  Oneil Parchment, MD     History of Present Illness: .   [name] is a 88 y.o. female Discussed the use of AI scribe software for clinical note transcription with the patient, who gave verbal consent to proceed.  History of Present Illness [name] is an 88 year old female with coronary artery disease and arrhythmias who presents for cardiovascular follow-up.  She has a history of nonobstructive coronary artery disease identified on catheterization in 2012 and is currently on low dose Crestor  and aspirin 81 mg. She reports feeling better since starting these medications.  She has minimal carotid artery disease with atherosclerotic plaque on the right side, as previously identified in 2018.  In 2017, she was diagnosed with multifocal atrial tachycardia on a ZIO monitor. She experiences palpitations, described as 'twitching' or 'skipping' sensations, with noted improvement on medication. No pain is associated with these episodes.  She has a right bundle branch block but no episodes of syncope.  She is off hydralazine and currently takes diltiazem  and lisinopril  in the morning.  She reports swelling and visible varicose veins, which she associates with fluid retention. She has difficulty using compression hose due to discomfort and tightness.  She experiences weakness and dizziness after standing for about fifteen minutes, particularly in her thighs, but is unsure if this is related to her heart condition.     Studies Reviewed: SABRA   EKG Interpretation Date/Time:  Wednesday February 22 2024 13:59:53 EDT Ventricular Rate:  60 PR Interval:  158 QRS Duration:  114 QT Interval:  432 QTC Calculation: 432 R Axis:   66  Text Interpretation: Normal sinus rhythm Incomplete right bundle branch block When  compared with ECG of 04-Jan-2023 11:47, Incomplete right bundle branch block has replaced Right bundle branch block Confirmed by Parchment Oneil ([id number]) on 02/22/2024 2:04:03 PM    Results RADIOLOGY Carotid artery ultrasound: Minimal carotid artery disease, right side atherosclerotic plaque similar to 2018 (2023) CT abdomen: Reassuring aortic atherosclerosis  DIAGNOSTIC Coronary angiography: Nonobstructive coronary artery disease (2012) ZI monitor: Multifocal atrial tachycardia, no evidence of atrial fibrillation (2017) Risk Assessment/Calculations:            Physical Exam:   VS:  BP (!) 132/50   Pulse 60   Ht 5' 4 (1.626 m)   Wt 149 lb 6.4 oz (67.8 kg)   SpO2 99%   BMI 25.64 kg/m    Wt Readings from Last 3 Encounters:  02/22/24 149 lb 6.4 oz (67.8 kg)  09/21/23 148 lb (67.1 kg)  01/04/23 143 lb (64.9 kg)    GEN: Well nourished, well developed in no acute distress NECK: No JVD; No carotid bruits CARDIAC: RRR, no murmurs, no rubs, no gallops RESPIRATORY:  Clear to auscultation without rales, wheezing or rhonchi  ABDOMEN: Soft, non-tender, non-distended EXTREMITIES: Mild left lower extremity edema with varicose veins noted.  Mild tremor noted  ASSESSMENT AND PLAN: .    Assessment and Plan Assessment & Plan Nonobstructive coronary artery disease and aortic atherosclerosis Nonobstructive coronary artery disease confirmed on catheterization in 2012. Aortic atherosclerosis seen on CT of the abdomen, personally reviewed. Current medical therapy includes low dose Crestor  and aspirin 81 mg. - Continue Crestor  and aspirin 81 mg daily  Right carotid artery atherosclerotic plaque Minimal carotid artery disease on the right  side, similar to findings from 2018 and 2023 studies. Managed with statin and aspirin therapy. - Continue statin and aspirin therapy, LDL 84 triglycerides 171 hemoglobin 12.6 potassium 4.6 ALT 13  Paroxysmal atrial tachycardia and palpitations Paroxysmal atrial  tachycardia noted on 2017 ZI monitor. Palpitations likely due to PVCs and PACs. No evidence of AFib. Symptoms have improved with current medication regimen. - Continue current medication regimen including diltiazem   Right bundle branch block Right bundle branch block present without any evidence of syncope.  Hyperlipidemia Hyperlipidemia managed with Crestor  5 mg daily. Current therapy is effective in maintaining stability of atherosclerotic conditions. - Continue Crestor  5 mg daily  Hypertension Hypertension well-controlled with current medication regimen including diltiazem  and lisinopril . Blood pressures are excellent. - Continue diltiazem  and lisinopril   Varicose veins of lower extremity Presence of varicose veins leading to swelling. She experiences difficulty with compression hose due to discomfort and tightness. No desire for surgical intervention at this time. - Recommend use of compression stockings if comfortable         Dispo: 1 yr  Signed, Oneil Parchment, MD

## 2024-02-22 NOTE — Patient Instructions (Addendum)
 Medication Instructions:  The current medical regimen is effective;  continue present plan and medications.  *If you need a refill on your cardiac medications before your next appointment, please call your pharmacy*  Follow-Up: At [facility], you and your health needs are our priority.  As part of our continuing mission to provide you with exceptional heart care, our providers are all part of one team.  This team includes your primary Cardiologist (physician) and Advanced Practice Providers or APPs (Physician Assistants and Nurse Practitioners) who all work together to provide you with the care you need, when you need it.  Your next appointment:   1 year(s)  Provider:   Dr Dorothye Gathers     We recommend signing up for the patient portal called "MyChart".  Sign up information is provided on this After Visit Summary.  MyChart is used to connect with patients for Virtual Visits (Telemedicine).  Patients are able to view lab/test results, encounter notes, upcoming appointments, etc.  Non-urgent messages can be sent to your provider as well.   To learn more about what you can do with MyChart, go to [email or web address].

## 2024-02-27 ENCOUNTER — Other Ambulatory Visit: Payer: Self-pay | Admitting: Cardiology

## 2024-03-20 ENCOUNTER — Ambulatory Visit: Admitting: Cardiology

## 2024-04-03 ENCOUNTER — Encounter: Payer: Self-pay | Admitting: Internal Medicine

## 2024-04-10 DIAGNOSIS — H43811 Vitreous degeneration, right eye: Secondary | ICD-10-CM | POA: Diagnosis not present

## 2024-04-16 DIAGNOSIS — H43811 Vitreous degeneration, right eye: Secondary | ICD-10-CM | POA: Diagnosis not present

## 2024-04-16 DIAGNOSIS — Z961 Presence of intraocular lens: Secondary | ICD-10-CM | POA: Diagnosis not present

## 2024-04-20 DIAGNOSIS — Z79899 Other long term (current) drug therapy: Secondary | ICD-10-CM | POA: Diagnosis not present

## 2024-04-20 DIAGNOSIS — Z0000 Encounter for general adult medical examination without abnormal findings: Secondary | ICD-10-CM | POA: Diagnosis not present

## 2024-04-20 DIAGNOSIS — I10 Essential (primary) hypertension: Secondary | ICD-10-CM | POA: Diagnosis not present

## 2024-04-20 DIAGNOSIS — E559 Vitamin D deficiency, unspecified: Secondary | ICD-10-CM | POA: Diagnosis not present

## 2024-04-20 DIAGNOSIS — Z8659 Personal history of other mental and behavioral disorders: Secondary | ICD-10-CM | POA: Diagnosis not present

## 2024-04-20 DIAGNOSIS — I4719 Other supraventricular tachycardia: Secondary | ICD-10-CM | POA: Diagnosis not present

## 2024-04-20 DIAGNOSIS — I6523 Occlusion and stenosis of bilateral carotid arteries: Secondary | ICD-10-CM | POA: Diagnosis not present

## 2024-04-20 DIAGNOSIS — I2510 Atherosclerotic heart disease of native coronary artery without angina pectoris: Secondary | ICD-10-CM | POA: Diagnosis not present

## 2024-04-20 DIAGNOSIS — Z1331 Encounter for screening for depression: Secondary | ICD-10-CM | POA: Diagnosis not present

## 2024-04-20 DIAGNOSIS — Z23 Encounter for immunization: Secondary | ICD-10-CM | POA: Diagnosis not present

## 2024-04-20 DIAGNOSIS — K219 Gastro-esophageal reflux disease without esophagitis: Secondary | ICD-10-CM | POA: Diagnosis not present

## 2024-04-20 DIAGNOSIS — M25561 Pain in right knee: Secondary | ICD-10-CM | POA: Diagnosis not present

## 2024-04-20 DIAGNOSIS — R600 Localized edema: Secondary | ICD-10-CM | POA: Diagnosis not present

## 2024-04-23 ENCOUNTER — Other Ambulatory Visit: Payer: Self-pay | Admitting: Cardiology

## 2024-05-15 DIAGNOSIS — Z85828 Personal history of other malignant neoplasm of skin: Secondary | ICD-10-CM | POA: Diagnosis not present

## 2024-05-15 DIAGNOSIS — D1801 Hemangioma of skin and subcutaneous tissue: Secondary | ICD-10-CM | POA: Diagnosis not present

## 2024-05-15 DIAGNOSIS — D0462 Carcinoma in situ of skin of left upper limb, including shoulder: Secondary | ICD-10-CM | POA: Diagnosis not present

## 2024-05-15 DIAGNOSIS — L821 Other seborrheic keratosis: Secondary | ICD-10-CM | POA: Diagnosis not present

## 2024-05-15 DIAGNOSIS — L718 Other rosacea: Secondary | ICD-10-CM | POA: Diagnosis not present

## 2024-05-15 DIAGNOSIS — D2261 Melanocytic nevi of right upper limb, including shoulder: Secondary | ICD-10-CM | POA: Diagnosis not present

## 2024-05-15 DIAGNOSIS — L578 Other skin changes due to chronic exposure to nonionizing radiation: Secondary | ICD-10-CM | POA: Diagnosis not present

## 2024-05-15 DIAGNOSIS — L814 Other melanin hyperpigmentation: Secondary | ICD-10-CM | POA: Diagnosis not present

## 2024-05-15 DIAGNOSIS — D2272 Melanocytic nevi of left lower limb, including hip: Secondary | ICD-10-CM | POA: Diagnosis not present

## 2024-08-06 ENCOUNTER — Ambulatory Visit: Admitting: Neurology

## 2024-08-08 ENCOUNTER — Ambulatory Visit: Admitting: Neurology
